# Patient Record
Sex: Female | Born: 1955 | Race: White | Hispanic: No | Marital: Married
Health system: Southern US, Community
[De-identification: ages and names within clinical notes are randomized; demographics above are authoritative.]

---

## 2014-08-14 DIAGNOSIS — I781 Nevus, non-neoplastic: Secondary | ICD-10-CM | POA: Insufficient documentation

## 2014-08-14 DIAGNOSIS — I8390 Asymptomatic varicose veins of unspecified lower extremity: Secondary | ICD-10-CM

## 2014-08-14 DIAGNOSIS — M7989 Other specified soft tissue disorders: Secondary | ICD-10-CM

## 2014-08-14 HISTORY — DX: Asymptomatic varicose veins of unspecified lower extremity: I83.90

## 2014-08-14 HISTORY — DX: Other specified soft tissue disorders: M79.89

## 2014-08-14 HISTORY — DX: Nevus, non-neoplastic: I78.1

## 2015-01-19 ENCOUNTER — Ambulatory Visit (INDEPENDENT_AMBULATORY_CARE_PROVIDER_SITE_OTHER): Payer: Managed Care, Other (non HMO)

## 2015-01-19 ENCOUNTER — Ambulatory Visit (INDEPENDENT_AMBULATORY_CARE_PROVIDER_SITE_OTHER): Payer: Managed Care, Other (non HMO) | Admitting: Sports Medicine

## 2015-01-19 ENCOUNTER — Encounter: Payer: Self-pay | Admitting: Sports Medicine

## 2015-01-19 VITALS — BP 150/89 | HR 78 | Ht 65.0 in | Wt 162.0 lb

## 2015-01-19 DIAGNOSIS — M25551 Pain in right hip: Secondary | ICD-10-CM

## 2015-01-19 DIAGNOSIS — M160 Bilateral primary osteoarthritis of hip: Secondary | ICD-10-CM

## 2015-01-19 HISTORY — DX: Bilateral primary osteoarthritis of hip: M16.0

## 2015-01-19 MED ORDER — MELOXICAM 15 MG PO TABS: ORAL_TABLET | ORAL | Status: DC

## 2015-01-19 NOTE — Progress Notes (Signed)
   Subjective:    I'm seeing this patient as a consultation for:  Dr. Robert Scott at [facility]  CC: Right hip pain  HPI: For several weeks this pleasant 59 year old female has had pain that she localizes deep in the groin, worse with inhalation, as well as flexion. She denies any injuries, pain has been fairly insidious. She's been using occasional oral Toradol, she was placed on gabapentin by her primary care provider which also has not been effective. Eventually she did have an MRI of the pelvis and hip, that was negative. X-rays did show some mild degenerative changes.  Pain is moderate, persistent, localized. No mechanical symptoms.  Past medical history, Surgical history, Family history not pertinant except as noted below, Social history, Allergies, and medications have been entered into the medical record, reviewed, and no changes needed.   Review of Systems: No headache, visual changes, nausea, vomiting, diarrhea, constipation, dizziness, abdominal pain, skin rash, fevers, chills, night sweats, weight loss, swollen lymph nodes, body aches, joint swelling, muscle aches, chest pain, shortness of breath, mood changes, visual or auditory hallucinations.   Objective:   General: Well Developed, well nourished, and in no acute distress.  Neuro/Psych: Alert and oriented x3, extra-ocular muscles intact, able to move all 4 extremities, sensation grossly intact. Skin: Warm and dry, no rashes noted.  Respiratory: Not using accessory muscles, speaking in full sentences, trachea midline.  Cardiovascular: Pulses palpable, no extremity edema. Abdomen: Does not appear distended. Right Hip: ROM IR: 60 Deg, ER: 60 Deg, Flexion: 120 Deg, Extension: 100 Deg, Abduction: 45 Deg, Adduction: 45 Deg, reproduction of pain with internal rotation. Strength IR: 5/5, ER: 5/5, Flexion: 5/5, Extension: 5/5, Abduction: 5/5, Adduction: 5/5 Pelvic alignment unremarkable to inspection and  palpation. Standing hip rotation and gait without trendelenburg / unsteadiness. Positive flexion adduction and internal rotation test. Greater trochanter without tenderness to palpation. No tenderness over piriformis. No SI joint tenderness and normal minimal SI movement.  Procedure: Real-time Ultrasound Guided Injection of right femoral acetabular joint Device: GE Logiq E  Verbal informed consent obtained.  Time-out conducted.  Noted no overlying erythema, induration, or other signs of local infection.  Skin prepped in a sterile fashion.  Local anesthesia: Topical Ethyl chloride.  With sterile technique and under real time ultrasound guidance:  Spinal needle advanced to the femoral head/neck junction, 2 mL kenalog 40, 4 mL lidocaine injected easily. Completed without difficulty  Pain immediately resolved suggesting accurate placement of the medication.  Advised to call if fevers/chills, erythema, induration, drainage, or persistent bleeding.  Images permanently stored and available for review in the ultrasound unit.  Impression: Technically successful ultrasound guided injection.  Impression and Recommendations:   This case required medical decision making of moderate complexity. [place] not here yetunder the 1:15

## 2015-01-19 NOTE — Assessment & Plan Note (Signed)
With degenerative changes on x-ray and a negative MRI of the hip and pelvis. 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 Switching to meloxicam, femoral acetabular injection as above, hip rehabilitation exercises.  Return in one month.

## 2015-02-21 ENCOUNTER — Ambulatory Visit (INDEPENDENT_AMBULATORY_CARE_PROVIDER_SITE_OTHER): Payer: Managed Care, Other (non HMO) | Admitting: Sports Medicine

## 2015-02-21 ENCOUNTER — Encounter: Payer: Self-pay | Admitting: Sports Medicine

## 2015-02-21 ENCOUNTER — Ambulatory Visit (INDEPENDENT_AMBULATORY_CARE_PROVIDER_SITE_OTHER): Payer: Managed Care, Other (non HMO)

## 2015-02-21 VITALS — BP 141/89 | HR 64 | Ht 65.5 in | Wt 161.0 lb

## 2015-02-21 DIAGNOSIS — M25561 Pain in right knee: Secondary | ICD-10-CM

## 2015-02-21 DIAGNOSIS — M7042 Prepatellar bursitis, left knee: Secondary | ICD-10-CM | POA: Diagnosis not present

## 2015-02-21 DIAGNOSIS — M170 Bilateral primary osteoarthritis of knee: Secondary | ICD-10-CM | POA: Insufficient documentation

## 2015-02-21 DIAGNOSIS — M8588 Other specified disorders of bone density and structure, other site: Secondary | ICD-10-CM

## 2015-02-21 DIAGNOSIS — M1611 Unilateral primary osteoarthritis, right hip: Secondary | ICD-10-CM

## 2015-02-21 HISTORY — DX: Bilateral primary osteoarthritis of knee: M17.0

## 2015-02-21 MED ORDER — IBUPROFEN-FAMOTIDINE 800-26.6 MG PO TABS: 1.0000 | ORAL_TABLET | Freq: Three times a day (TID) | ORAL | Status: DC

## 2015-02-21 NOTE — Assessment & Plan Note (Signed)
Minimal, nontender. May return for surgical excision if desires.

## 2015-02-21 NOTE — Assessment & Plan Note (Signed)
Bilateral x-rays, Duexis samples given. Rehabilitation exercises given. If no better in 3-4 weeks return for bilateral injections.

## 2015-02-21 NOTE — Progress Notes (Signed)
  Subjective:    CC: follow-up  HPI: Right hip osteoarthritis: Doing extremely well with 98% pain relief after femoral acetabular joint injection at the last visit.  Bilateral knee pain: History of osteoarthritis, amenable to try conservative measures before injecting. No mechanical symptoms, pain is at the joint lines.  Mass left knee: For sometime now another has been a well-defined, movable, only minimally tender mass over the anterior aspect of the left patella.  Past medical history, Surgical history, Family history not pertinant except as noted below, Social history, Allergies, and medications have been entered into the medical record, reviewed, and no changes needed.   Review of Systems: No fevers, chills, night sweats, weight loss, chest pain, or shortness of breath.   Objective:    General: Well Developed, well nourished, and in no acute distress.  Neuro: Alert and oriented x3, extra-ocular muscles intact, sensation grossly intact.  HEENT: Normocephalic, atraumatic, pupils equal round reactive to light, neck supple, no masses, no lymphadenopathy, thyroid nonpalpable.  Skin: Warm and dry, no rashes. Cardiac: Regular rate and rhythm, no murmurs rubs or gallops, no lower extremity edema.  Respiratory: Clear to auscultation bilaterally. Not using accessory muscles, speaking in full sentences. Bilateral knees: Right worse than left effusion with a palpable fluid wave, there is also a visible and palpable, well-defined, 1.1 cm movable nodule over the left patella. This is consistent with the prepatellar bursa versus subcutaneous cyst ROM normal in flexion and extension and lower leg rotation. Ligaments with solid consistent endpoints including ACL, PCL, LCL, MCL. Negative Mcmurray's and provocative meniscal tests. Non painful patellar compression. Patellar and quadriceps tendons unremarkable. Hamstring and quadriceps strength is normal.  Impression and Recommendations:

## 2015-02-21 NOTE — Assessment & Plan Note (Signed)
Doing well after femoral acetabular joint injection. Return as needed for this.

## 2015-03-02 ENCOUNTER — Other Ambulatory Visit: Payer: Self-pay

## 2015-03-02 DIAGNOSIS — M7042 Prepatellar bursitis, left knee: Secondary | ICD-10-CM

## 2015-03-02 MED ORDER — IBUPROFEN-FAMOTIDINE 800-26.6 MG PO TABS: 1.0000 | ORAL_TABLET | Freq: Three times a day (TID) | ORAL | Status: DC

## 2015-03-13 ENCOUNTER — Ambulatory Visit (INDEPENDENT_AMBULATORY_CARE_PROVIDER_SITE_OTHER): Payer: Managed Care, Other (non HMO) | Admitting: Sports Medicine

## 2015-03-13 ENCOUNTER — Encounter: Payer: Self-pay | Admitting: Sports Medicine

## 2015-03-13 VITALS — BP 161/94 | HR 92 | Ht 65.5 in | Wt 162.0 lb

## 2015-03-13 DIAGNOSIS — M160 Bilateral primary osteoarthritis of hip: Secondary | ICD-10-CM | POA: Diagnosis not present

## 2015-03-13 DIAGNOSIS — M170 Bilateral primary osteoarthritis of knee: Secondary | ICD-10-CM | POA: Diagnosis not present

## 2015-03-13 DIAGNOSIS — M7042 Prepatellar bursitis, left knee: Secondary | ICD-10-CM

## 2015-03-13 NOTE — Assessment & Plan Note (Signed)
Excellent response to right femoral acetabular injection, left femoral acetabular injection today. Return for custom orthotics.

## 2015-03-13 NOTE — Progress Notes (Signed)
  Subjective:    CC: Left hip pain  HPI: Kristin Rosales returns, we did a right femoral acetabular injection for osteoarthritis and she improved considerably. She is here with similar pain in the left side and would like interventional treatment today, pain is moderate, persistent, localized in the groin without radiation, worse with weightbearing and with stiffness in the mornings.  Prepatellar bursitis: Left knee, would like excision.  Knee osteoarthritis: Doing okay with Duexis.  Past medical history, Surgical history, Family history not pertinant except as noted below, Social history, Allergies, and medications have been entered into the medical record, reviewed, and no changes needed.   Review of Systems: No fevers, chills, night sweats, weight loss, chest pain, or shortness of breath.   Objective:    General: Well Developed, well nourished, and in no acute distress.  Neuro: Alert and oriented x3, extra-ocular muscles intact, sensation grossly intact.  HEENT: Normocephalic, atraumatic, pupils equal round reactive to light, neck supple, no masses, no lymphadenopathy, thyroid nonpalpable.  Skin: Warm and dry, no rashes. Cardiac: Regular rate and rhythm, no murmurs rubs or gallops, no lower extremity edema.  Respiratory: Clear to auscultation bilaterally. Not using accessory muscles, speaking in full sentences. Left Hip: ROM IR: 60 Deg, ER: 60 Deg, Flexion: 120 Deg, Extension: 100 Deg, Abduction: 45 Deg, Adduction: 45 Deg, internal rotation causes pain Strength IR: 5/5, ER: 5/5, Flexion: 5/5, Extension: 5/5, Abduction: 5/5, Adduction: 5/5 Pelvic alignment unremarkable to inspection and palpation. Standing hip rotation and gait without trendelenburg / unsteadiness. Greater trochanter without tenderness to palpation. No tenderness over piriformis. No SI joint tenderness and normal minimal SI movement. Left knee: 2 cm subcutaneous cyst palpable over the patella.  Procedure: Real-time  Ultrasound Guided Injection of left femoroacetabular joint Device: GE Logiq E  Verbal informed consent obtained.  Time-out conducted.  Noted no overlying erythema, induration, or other signs of local infection.  Skin prepped in a sterile fashion.  Local anesthesia: Topical Ethyl chloride.  With sterile technique and under real time ultrasound guidance:  Spinal needle advanced in the femoral head/neck junction, 2 mL kenalog 40, 4 mL lidocaine injected easily. Completed without difficulty  Pain immediately resolved suggesting accurate placement of the medication.  Advised to call if fevers/chills, erythema, induration, drainage, or persistent bleeding.  Images permanently stored and available for review in the ultrasound unit.  Impression: Technically successful ultrasound guided injection.  Impression and Recommendations:

## 2015-03-13 NOTE — Assessment & Plan Note (Signed)
Return for excision. There is a small 2 cm prepatellar bursal cyst on the left knee

## 2015-03-13 NOTE — Assessment & Plan Note (Signed)
Return for custom orthotics 

## 2015-03-15 ENCOUNTER — Ambulatory Visit: Payer: Managed Care, Other (non HMO) | Admitting: Sports Medicine

## 2015-03-21 ENCOUNTER — Ambulatory Visit: Payer: Managed Care, Other (non HMO) | Admitting: Sports Medicine

## 2015-03-25 DIAGNOSIS — L03116 Cellulitis of left lower limb: Secondary | ICD-10-CM

## 2015-03-25 DIAGNOSIS — R110 Nausea: Secondary | ICD-10-CM

## 2015-03-25 DIAGNOSIS — I10 Essential (primary) hypertension: Secondary | ICD-10-CM

## 2015-03-25 DIAGNOSIS — M79605 Pain in left leg: Secondary | ICD-10-CM

## 2015-03-25 HISTORY — DX: Pain in left leg: M79.605

## 2015-03-25 HISTORY — DX: Cellulitis of left lower limb: L03.116

## 2015-03-25 HISTORY — DX: Essential (primary) hypertension: I10

## 2015-03-25 HISTORY — DX: Nausea: R11.0

## 2015-03-27 ENCOUNTER — Ambulatory Visit: Payer: Managed Care, Other (non HMO) | Admitting: Sports Medicine

## 2015-03-30 ENCOUNTER — Ambulatory Visit: Payer: Managed Care, Other (non HMO) | Admitting: Sports Medicine

## 2015-04-02 ENCOUNTER — Encounter: Payer: Managed Care, Other (non HMO) | Admitting: Sports Medicine

## 2015-04-06 ENCOUNTER — Ambulatory Visit: Payer: Managed Care, Other (non HMO) | Admitting: Sports Medicine

## 2015-04-12 ENCOUNTER — Encounter: Payer: Managed Care, Other (non HMO) | Admitting: Sports Medicine

## 2015-04-16 ENCOUNTER — Ambulatory Visit: Payer: Managed Care, Other (non HMO) | Admitting: Sports Medicine

## 2015-04-17 ENCOUNTER — Ambulatory Visit (INDEPENDENT_AMBULATORY_CARE_PROVIDER_SITE_OTHER): Payer: Managed Care, Other (non HMO) | Admitting: Sports Medicine

## 2015-04-17 ENCOUNTER — Encounter: Payer: Self-pay | Admitting: Sports Medicine

## 2015-04-17 VITALS — BP 135/87 | HR 65 | Ht 65.0 in | Wt 159.0 lb

## 2015-04-17 DIAGNOSIS — M160 Bilateral primary osteoarthritis of hip: Secondary | ICD-10-CM | POA: Diagnosis not present

## 2015-04-17 NOTE — Assessment & Plan Note (Signed)
Custom orthotics as above, essentially complete pain relief after hip joint injection at the last visit.

## 2015-04-17 NOTE — Progress Notes (Signed)

## 2015-04-23 ENCOUNTER — Ambulatory Visit (INDEPENDENT_AMBULATORY_CARE_PROVIDER_SITE_OTHER): Payer: Managed Care, Other (non HMO) | Admitting: Sports Medicine

## 2015-04-23 ENCOUNTER — Encounter: Payer: Self-pay | Admitting: Sports Medicine

## 2015-04-23 VITALS — BP 158/93 | HR 79 | Ht 65.0 in | Wt 159.0 lb

## 2015-04-23 DIAGNOSIS — M7042 Prepatellar bursitis, left knee: Secondary | ICD-10-CM | POA: Diagnosis not present

## 2015-04-23 DIAGNOSIS — M170 Bilateral primary osteoarthritis of knee: Secondary | ICD-10-CM

## 2015-04-23 NOTE — Assessment & Plan Note (Addendum)
During local anesthesia the prepatellar bursa was decompressed, decision was made not to proceed with surgical excision.

## 2015-04-23 NOTE — Assessment & Plan Note (Signed)
Left-sided knee steroidal injection as above.  Return in one month. She tells me if things go well with the left knee she would like me to do the same with the right knee.

## 2015-04-23 NOTE — Progress Notes (Signed)
  Subjective:    CC: Follow-up  HPI: Prepatellar bursitis: Here for consideration of excision  Knee osteoarthritis: Left-sided, pain at the medial and lateral joint lines, stiffness in the morning and with bending the knee, no radiation, no mechanical symptoms.  Past medical history, Surgical history, Family history not pertinant except as noted below, Social history, Allergies, and medications have been entered into the medical record, reviewed, and no changes needed.   Review of Systems: No fevers, chills, night sweats, weight loss, chest pain, or shortness of breath.   Objective:    General: Well Developed, well nourished, and in no acute distress.  Neuro: Alert and oriented x3, extra-ocular muscles intact, sensation grossly intact.  HEENT: Normocephalic, atraumatic, pupils equal round reactive to light, neck supple, no masses, no lymphadenopathy, thyroid nonpalpable.  Skin: Warm and dry, no rashes. Cardiac: Regular rate and rhythm, no murmurs rubs or gallops, no lower extremity edema.  Respiratory: Clear to auscultation bilaterally. Not using accessory muscles, speaking in full sentences. Left Knee: Visible prepatellar bursa, also tender to palpation at the medial and lateral joint lines ROM normal in flexion and extension and lower leg rotation. Ligaments with solid consistent endpoints including ACL, PCL, LCL, MCL. Negative Mcmurray's and provocative meniscal tests. Non painful patellar compression. Patellar and quadriceps tendons unremarkable. Hamstring and quadriceps strength is normal.  During the local anesthesia for the prepatellar bursa, the bursa was decompressed, and decision was made not to proceed with an attempt on excision.  Procedure: Real-time Ultrasound Guided Injection of left knee Device: GE Logiq E  Verbal informed consent obtained.  Time-out conducted.  Noted no overlying erythema, induration, or other signs of local infection.  Skin prepped in a sterile  fashion.  Local anesthesia: Topical Ethyl chloride.  With sterile technique and under real time ultrasound guidance:  2 mL Kenalog 40, 4 mL lidocaine injected easily Completed without difficulty  Pain immediately resolved suggesting accurate placement of the medication.  Advised to call if fevers/chills, erythema, induration, drainage, or persistent bleeding.  Images permanently stored and available for review in the ultrasound unit.  Impression: Technically successful ultrasound guided injection.  Impression and Recommendations:

## 2015-05-18 ENCOUNTER — Telehealth: Payer: Self-pay

## 2015-05-18 DIAGNOSIS — M7042 Prepatellar bursitis, left knee: Secondary | ICD-10-CM

## 2015-05-18 MED ORDER — IBUPROFEN-FAMOTIDINE 800-26.6 MG PO TABS: 1.0000 | ORAL_TABLET | Freq: Three times a day (TID) | ORAL | Status: DC

## 2015-05-18 NOTE — Telephone Encounter (Signed)
Patient to find out if there is a cheaper pharmacy for Duexis. She received the discount on the first prescription but wasn't sure if she would receive it for the second.

## 2015-05-18 NOTE — Telephone Encounter (Signed)
Yes, sending it to [facility] pharmacy in [place], they will call her and mail it overnight.

## 2015-05-21 ENCOUNTER — Other Ambulatory Visit: Payer: Self-pay | Admitting: Sports Medicine

## 2015-05-21 DIAGNOSIS — M7042 Prepatellar bursitis, left knee: Secondary | ICD-10-CM

## 2015-05-21 MED ORDER — IBUPROFEN-FAMOTIDINE 800-26.6 MG PO TABS: 1.0000 | ORAL_TABLET | Freq: Three times a day (TID) | ORAL | Status: DC

## 2015-05-21 NOTE — Telephone Encounter (Signed)
Left detailed message advising of recommendations.  

## 2015-05-24 ENCOUNTER — Ambulatory Visit: Payer: Managed Care, Other (non HMO) | Admitting: Sports Medicine

## 2015-06-14 ENCOUNTER — Ambulatory Visit: Payer: Managed Care, Other (non HMO) | Admitting: Sports Medicine

## 2015-07-16 ENCOUNTER — Ambulatory Visit: Payer: Managed Care, Other (non HMO) | Admitting: Sports Medicine

## 2015-07-23 ENCOUNTER — Ambulatory Visit: Payer: Managed Care, Other (non HMO) | Admitting: Sports Medicine

## 2015-08-02 ENCOUNTER — Ambulatory Visit (INDEPENDENT_AMBULATORY_CARE_PROVIDER_SITE_OTHER): Payer: Managed Care, Other (non HMO) | Admitting: Sports Medicine

## 2015-08-02 ENCOUNTER — Encounter: Payer: Self-pay | Admitting: Sports Medicine

## 2015-08-02 VITALS — BP 148/92 | HR 71 | Wt 151.0 lb

## 2015-08-02 DIAGNOSIS — M7042 Prepatellar bursitis, left knee: Secondary | ICD-10-CM | POA: Diagnosis not present

## 2015-08-02 MED ORDER — IBUPROFEN-FAMOTIDINE 800-26.6 MG PO TABS: 1.0000 | ORAL_TABLET | Freq: Three times a day (TID) | ORAL | Status: DC

## 2015-08-02 NOTE — Assessment & Plan Note (Signed)
Injection as above. Return in one month, surgical excision if no better.

## 2015-08-02 NOTE — Progress Notes (Signed)
  Subjective:    CC: follow-up  HPI: His is a pleasant 59 year old female,previously we were going to excise a left prepatellar bursa,the bursa was decompressed during the local anesthesia and this was aborted. She now has a recurrence, and desires interventional treatment today.  Bilateral hip arthritis: Doing well.Only has occasional pain here and there.  Past medical history, Surgical history, Family history not pertinant except as noted below, Social history, Allergies, and medications have been entered into the medical record, reviewed, and no changes needed.   Review of Systems: No fevers, chills, night sweats, weight loss, chest pain, or shortness of breath.   Objective:    General: Well Developed, well nourished, and in no acute distress.  Neuro: Alert and oriented x3, extra-ocular muscles intact, sensation grossly intact.  HEENT: Normocephalic, atraumatic, pupils equal round reactive to light, neck supple, no masses, no lymphadenopathy, thyroid nonpalpable.  Skin: Warm and dry, no rashes. Cardiac: Regular rate and rhythm, no murmurs rubs or gallops, no lower extremity edema.  Respiratory: Clear to auscultation bilaterally. Not using accessory muscles, speaking in full sentences. Left Knee: Visible and palpable nontender prepatellar bursa over the anterior knee. ROM normal in flexion and extension and lower leg rotation. Ligaments with solid consistent endpoints including ACL, PCL, LCL, MCL. Negative Mcmurray's and provocative meniscal tests. Non painful patellar compression. Patellar and quadriceps tendons unremarkable. Hamstring and quadriceps strength is normal.  Procedure: Real-time Ultrasound Guided Injection of left patellar bursa Device: GE Logiq E  Verbal informed consent obtained.  Time-out conducted.  Noted no overlying erythema, induration, or other signs of local infection.  Skin prepped in a sterile fashion.  Local anesthesia: Topical Ethyl chloride.  With  sterile technique and under real time ultrasound guidance:  25-gauge needle advanced into hypoechoic structure, 1 mL kenalog 40 injected easily. Completed without difficulty  Pain immediately resolved suggesting accurate placement of the medication.  Advised to call if fevers/chills, erythema, induration, drainage, or persistent bleeding.  Images permanently stored and available for review in the ultrasound unit.  Impression: Technically successful ultrasound guided injection.  The knee was then strapped with compressive dressing.  Impression and Recommendations:

## 2015-08-30 ENCOUNTER — Ambulatory Visit: Payer: Managed Care, Other (non HMO) | Admitting: Sports Medicine

## 2016-01-08 ENCOUNTER — Other Ambulatory Visit: Payer: Self-pay | Admitting: *Deleted

## 2016-01-08 DIAGNOSIS — M7042 Prepatellar bursitis, left knee: Secondary | ICD-10-CM

## 2016-01-08 MED ORDER — IBUPROFEN-FAMOTIDINE 800-26.6 MG PO TABS: 1.0000 | ORAL_TABLET | Freq: Three times a day (TID) | ORAL | Status: DC

## 2016-05-08 ENCOUNTER — Telehealth: Payer: Self-pay

## 2016-05-08 DIAGNOSIS — M7042 Prepatellar bursitis, left knee: Secondary | ICD-10-CM

## 2016-05-08 NOTE — Telephone Encounter (Signed)
Pt was given a rx for Duexis but due to recent changes in insurance the prices are too much. Would like to know what she can do different to get this rx. Please advise.

## 2016-05-09 ENCOUNTER — Other Ambulatory Visit: Payer: Self-pay | Admitting: Sports Medicine

## 2016-05-09 DIAGNOSIS — M7042 Prepatellar bursitis, left knee: Secondary | ICD-10-CM

## 2016-05-09 MED ORDER — IBUPROFEN-FAMOTIDINE 800-26.6 MG PO TABS: 1.0000 | ORAL_TABLET | Freq: Three times a day (TID) | ORAL | 11 refills | Status: DC

## 2016-05-09 NOTE — Telephone Encounter (Signed)
We are going to try it again but this time to [facility] in [place]. (mail order)

## 2016-05-09 NOTE — Telephone Encounter (Signed)
Left message advising patient of new pharmacy.

## 2016-07-02 ENCOUNTER — Telehealth: Payer: Self-pay | Admitting: Family Medicine

## 2016-07-02 ENCOUNTER — Other Ambulatory Visit: Payer: Self-pay

## 2016-07-02 DIAGNOSIS — M7042 Prepatellar bursitis, left knee: Secondary | ICD-10-CM

## 2016-07-02 MED ORDER — IBUPROFEN-FAMOTIDINE 800-26.6 MG PO TABS: 1.0000 | ORAL_TABLET | Freq: Three times a day (TID) | ORAL | 11 refills | Status: DC

## 2016-07-02 NOTE — Telephone Encounter (Signed)
Leta, Pt calling about her Duexsis. The best number to call her on is her cell number & she says she has vm.  I gave her your message that you will talk to Dr T. Thank you.

## 2017-06-22 DIAGNOSIS — S00411A Abrasion of right ear, initial encounter: Secondary | ICD-10-CM

## 2017-06-22 HISTORY — DX: Abrasion of right ear, initial encounter: S00.411A

## 2017-08-26 DIAGNOSIS — R946 Abnormal results of thyroid function studies: Secondary | ICD-10-CM | POA: Diagnosis not present

## 2017-08-26 DIAGNOSIS — D649 Anemia, unspecified: Secondary | ICD-10-CM | POA: Diagnosis not present

## 2017-11-20 ENCOUNTER — Ambulatory Visit (INDEPENDENT_AMBULATORY_CARE_PROVIDER_SITE_OTHER): Payer: Managed Care, Other (non HMO) | Admitting: Sports Medicine

## 2017-11-20 DIAGNOSIS — M533 Sacrococcygeal disorders, not elsewhere classified: Secondary | ICD-10-CM

## 2017-11-20 DIAGNOSIS — M160 Bilateral primary osteoarthritis of hip: Secondary | ICD-10-CM

## 2017-11-20 HISTORY — DX: Sacrococcygeal disorders, not elsewhere classified: M53.3

## 2017-11-20 MED ORDER — CELECOXIB 200 MG PO CAPS
ORAL_CAPSULE | ORAL | 2 refills | Status: DC
Start: 2017-11-20 — End: 2018-01-12

## 2017-11-20 NOTE — Assessment & Plan Note (Addendum)
Continues to do well after injection 3 years ago, right hip is having a slight recurrence of pain compared to the left. We are going to switch her from ibuprofen to Celebrex, she is having some trochanteric bursitis type pain as well, right-sided. Has failed ibuprofen, naproxen, meloxicam. If persistence of symptoms after rehab exercises then we will do an injection.

## 2017-11-20 NOTE — Assessment & Plan Note (Signed)
Adding sacroiliac joint rehab exercises, return in 2-4 weeks, injection if no better.

## 2017-11-20 NOTE — Progress Notes (Signed)
Subjective:    I'm seeing this patient as a consultation for: Dr. Lucila Maineobert Scott  CC: Right hip pain  HPI: This is a pleasant 62 year old female, I treated her 3 years ago for bilateral hip pain, we did a hip joint injection on both sides separated by a month, she did extremely well.  She returns today, her hip joint pain has returned, although mild, not back to how much it hurt before her injection.  She describes her pain differently today, more laterally in the right hip, but in the groin and the left hip.  She also describes moderate pain over her right sacroiliac joint without radiation.  Symptoms are moderate, persistent, she does find it difficult to lay on the ipsilateral side.  I reviewed the past medical history, family history, social history, surgical history, and allergies today and no changes were needed.  Please see the problem list section below in epic for further details.  Past Medical History: No past medical history on file. Past Surgical History: No past surgical history on file. Social History: Social History   Socioeconomic History  . Marital status: Married    Spouse name: Not on file  . Number of children: Not on file  . Years of education: Not on file  . Highest education level: Not on file  Occupational History  . Not on file  Social Needs  . Financial resource strain: Not on file  . Food insecurity:    Worry: Not on file    Inability: Not on file  . Transportation needs:    Medical: Not on file    Non-medical: Not on file  Tobacco Use  . Smoking status: Unknown If Ever Smoked  Substance and Sexual Activity  . Alcohol use: Not on file  . Drug use: Not on file  . Sexual activity: Not on file  Lifestyle  . Physical activity:    Days per week: Not on file    Minutes per session: Not on file  . Stress: Not on file  Relationships  . Social connections:    Talks on phone: Not on file    Gets together: Not on file    Attends religious service:  Not on file    Active member of club or organization: Not on file    Attends meetings of clubs or organizations: Not on file    Relationship status: Not on file  Other Topics Concern  . Not on file  Social History Narrative  . Not on file   Family History: No family history on file. Allergies: Allergies  Allergen Reactions  . Penicillins Hives    PATIENT STATED WHEN SHE WAS A CHILD SHE BROKE OUT IN HIVES AFTER TAKING PENICILLIN   Medications: See med rec.  Review of Systems: No headache, visual changes, nausea, vomiting, diarrhea, constipation, dizziness, abdominal pain, skin rash, fevers, chills, night sweats, weight loss, swollen lymph nodes, body aches, joint swelling, muscle aches, chest pain, shortness of breath, mood changes, visual or auditory hallucinations.   Objective:   General: Well Developed, well nourished, and in no acute distress.  Neuro:  Extra-ocular muscles intact, able to move all 4 extremities, sensation grossly intact.  Deep tendon reflexes tested were normal. Psych: Alert and oriented, mood congruent with affect. ENT:  Ears and nose appear unremarkable.  Hearing grossly normal. Neck: Unremarkable overall appearance, trachea midline.  No visible thyroid enlargement. Eyes: Conjunctivae and lids appear unremarkable.  Pupils equal and round. Skin: Warm and dry, no rashes  noted.  Cardiovascular: Pulses palpable, no extremity edema. Right hip: ROM IR: 60 Deg, ER: 60 Deg, Flexion: 120 Deg, Extension: 100 Deg, Abduction: 45 Deg, Adduction: 45 Deg Strength IR: 5/5, ER: 5/5, Flexion: 5/5, Extension: 5/5, Abduction: 5/5, Adduction: 5/5 Pelvic alignment unremarkable to inspection and palpation. Standing hip rotation and gait without trendelenburg / unsteadiness. Greater trochanter with tenderness to palpation. No tenderness over piriformis. No SI joint tenderness and normal minimal SI movement. Back Exam:  Inspection: Unremarkable  Motion: Flexion 45 deg,  Extension 45 deg, Side Bending to 45 deg bilaterally,  Rotation to 45 deg bilaterally  SLR laying: Negative  XSLR laying: Negative  Palpable tenderness: Right sacroiliac joint. FABER: negative. Sensory change: Gross sensation intact to all lumbar and sacral dermatomes.  Reflexes: 2+ at both patellar tendons, 2+ at achilles tendons, Babinski's downgoing.  Strength at foot  Plantar-flexion: 5/5 Dorsi-flexion: 5/5 Eversion: 5/5 Inversion: 5/5  Leg strength  Quad: 5/5 Hamstring: 5/5 Hip flexor: 5/5 Hip abductors: 5/5  Gait unremarkable.  Impression and Recommendations:   This case required medical decision making of moderate complexity.  Primary osteoarthritis of both hips Continues to do well after injection 3 years ago, right hip is having a slight recurrence of pain compared to the left. We are going to switch her from ibuprofen to Celebrex, she is having some trochanteric bursitis type pain as well, right-sided. Has failed ibuprofen, naproxen, meloxicam. If persistence of symptoms after rehab exercises then we will do an injection.  Pain of right sacroiliac joint Adding sacroiliac joint rehab exercises, return in 2-4 weeks, injection if no better. ___________________________________________ Ihor Austin. Benjamin Stain, M.D., ABFM., CAQSM. Primary Care and Sports Medicine [facility]  Adjunct Instructor of Family Medicine  [employer]

## 2018-01-12 ENCOUNTER — Other Ambulatory Visit: Payer: Self-pay | Admitting: Sports Medicine

## 2018-01-12 DIAGNOSIS — M160 Bilateral primary osteoarthritis of hip: Secondary | ICD-10-CM

## 2018-02-09 ENCOUNTER — Other Ambulatory Visit: Payer: Self-pay | Admitting: Sports Medicine

## 2018-02-09 DIAGNOSIS — M160 Bilateral primary osteoarthritis of hip: Secondary | ICD-10-CM

## 2018-02-24 ENCOUNTER — Other Ambulatory Visit: Payer: Self-pay | Admitting: Sports Medicine

## 2018-02-24 DIAGNOSIS — M160 Bilateral primary osteoarthritis of hip: Secondary | ICD-10-CM

## 2018-06-03 ENCOUNTER — Other Ambulatory Visit: Payer: Self-pay | Admitting: Sports Medicine

## 2018-06-03 DIAGNOSIS — M160 Bilateral primary osteoarthritis of hip: Secondary | ICD-10-CM

## 2018-09-04 ENCOUNTER — Other Ambulatory Visit: Payer: Self-pay | Admitting: Sports Medicine

## 2018-09-04 DIAGNOSIS — M160 Bilateral primary osteoarthritis of hip: Secondary | ICD-10-CM

## 2019-03-17 ENCOUNTER — Ambulatory Visit (INDEPENDENT_AMBULATORY_CARE_PROVIDER_SITE_OTHER): Payer: Managed Care, Other (non HMO)

## 2019-03-17 ENCOUNTER — Ambulatory Visit (INDEPENDENT_AMBULATORY_CARE_PROVIDER_SITE_OTHER): Payer: Managed Care, Other (non HMO) | Admitting: Sports Medicine

## 2019-03-17 ENCOUNTER — Other Ambulatory Visit: Payer: Self-pay

## 2019-03-17 DIAGNOSIS — M160 Bilateral primary osteoarthritis of hip: Secondary | ICD-10-CM

## 2019-03-17 DIAGNOSIS — M533 Sacrococcygeal disorders, not elsewhere classified: Secondary | ICD-10-CM

## 2019-03-17 DIAGNOSIS — M7042 Prepatellar bursitis, left knee: Secondary | ICD-10-CM

## 2019-03-17 MED ORDER — IBUPROFEN-FAMOTIDINE 800-26.6 MG PO TABS: 1.0000 | ORAL_TABLET | Freq: Three times a day (TID) | ORAL | 11 refills | Status: DC

## 2019-03-17 MED ORDER — CYCLOBENZAPRINE HCL 10 MG PO TABS
ORAL_TABLET | ORAL | 0 refills | Status: DC
Start: 2019-03-17 — End: 2022-08-11

## 2019-03-17 NOTE — Progress Notes (Signed)
Subjective:    CC: Hip pain  HPI: This is a very pleasant 63 year old female, treated her in the distant past for hip osteoarthritis with an injection approximately 4 years ago, this worked extremely well.  She also has right SI joint pathology.  Historically she has done well with Duexis, more recently having a recurrence of pain in the right anterolateral hip and groin, moderate gelling.  Occasional catching of the mechanical symptoms, no trauma.  I reviewed the past medical history, family history, social history, surgical history, and allergies today and no changes were needed.  Please see the problem list section below in epic for further details.  Past Medical History: No past medical history on file. Past Surgical History: No past surgical history on file. Social History: Social History   Socioeconomic History  . Marital status: Married    Spouse name: Not on file  . Number of children: Not on file  . Years of education: Not on file  . Highest education level: Not on file  Occupational History  . Not on file  Social Needs  . Financial resource strain: Not on file  . Food insecurity    Worry: Not on file    Inability: Not on file  . Transportation needs    Medical: Not on file    Non-medical: Not on file  Tobacco Use  . Smoking status: Unknown If Ever Smoked  Substance and Sexual Activity  . Alcohol use: Not on file  . Drug use: Not on file  . Sexual activity: Not on file  Lifestyle  . Physical activity    Days per week: Not on file    Minutes per session: Not on file  . Stress: Not on file  Relationships  . Social Musicianconnections    Talks on phone: Not on file    Gets together: Not on file    Attends religious service: Not on file    Active member of club or organization: Not on file    Attends meetings of clubs or organizations: Not on file    Relationship status: Not on file  Other Topics Concern  . Not on file  Social History Narrative  . Not on file    Family History: No family history on file. Allergies: Allergies  Allergen Reactions  . Penicillins Hives    PATIENT STATED WHEN SHE WAS A CHILD SHE BROKE OUT IN [place]ES AFTER TAKING PENICILLIN   Medications: See med rec.  Review of Systems: No fevers, chills, night sweats, weight loss, chest pain, or shortness of breath.   Objective:    General: Well Developed, well nourished, and in no acute distress.  Neuro: Alert and oriented x3, extra-ocular muscles intact, sensation grossly intact.  HEENT: Normocephalic, atraumatic, pupils equal round reactive to light, neck supple, no masses, no lymphadenopathy, thyroid nonpalpable.  Skin: Warm and dry, no rashes. Cardiac: Regular rate and rhythm, no murmurs rubs or gallops, no lower extremity edema.  Respiratory: Clear to auscultation bilaterally. Not using accessory muscles, speaking in full sentences. Right hip: ROM IR: 60 Deg, reproduction of pain with internal rotation, ER: 60 Deg, Flexion: 120 Deg, Extension: 100 Deg, Abduction: 45 Deg, Adduction: 45 Deg Strength IR: 5/5, ER: 5/5, Flexion: 5/5, Extension: 5/5, Abduction: 5/5, Adduction: 5/5 Pelvic alignment unremarkable to inspection and palpation. Standing hip rotation and gait without trendelenburg / unsteadiness. Greater trochanter without tenderness to palpation. No tenderness over piriformis. No SI joint tenderness and normal minimal SI movement.  Hip x-rays personally reviewed,  only mild osteoarthritis.  Impression and Recommendations:    Primary osteoarthritis of both hips Recurrence of pain, right worse than left. I injected her back in 2016, she is just now starting to have a slight recurrence of pain. Refilling Duexis per her request. If no better in a few weeks we can do hip joint injections. I would like updated x-rays.  Pain of right sacroiliac joint Refilling Duexis, adding Flexeril at bedtime.    ___________________________________________ Gwen Her.  Dianah Field, M.D., ABFM., CAQSM. Primary Care and Sports Medicine [facility] [facility]  Adjunct Professor of [place] [employer]

## 2019-03-17 NOTE — Assessment & Plan Note (Signed)
Refilling Duexis, adding Flexeril at bedtime.

## 2019-03-17 NOTE — Assessment & Plan Note (Signed)
Recurrence of pain, right worse than left. I injected her back in 2016, she is just now starting to have a slight recurrence of pain. Refilling Duexis per her request. If no better in a few weeks we can do hip joint injections. I would like updated x-rays.

## 2019-12-12 ENCOUNTER — Other Ambulatory Visit: Payer: Self-pay

## 2019-12-12 ENCOUNTER — Ambulatory Visit (INDEPENDENT_AMBULATORY_CARE_PROVIDER_SITE_OTHER): Payer: Managed Care, Other (non HMO)

## 2019-12-12 ENCOUNTER — Ambulatory Visit (INDEPENDENT_AMBULATORY_CARE_PROVIDER_SITE_OTHER): Payer: Managed Care, Other (non HMO) | Admitting: Sports Medicine

## 2019-12-12 ENCOUNTER — Encounter: Payer: Self-pay | Admitting: Sports Medicine

## 2019-12-12 DIAGNOSIS — M25512 Pain in left shoulder: Secondary | ICD-10-CM | POA: Diagnosis not present

## 2019-12-12 DIAGNOSIS — Y990 Civilian activity done for income or pay: Secondary | ICD-10-CM | POA: Diagnosis not present

## 2019-12-12 DIAGNOSIS — M19022 Primary osteoarthritis, left elbow: Secondary | ICD-10-CM | POA: Insufficient documentation

## 2019-12-12 DIAGNOSIS — M25422 Effusion, left elbow: Secondary | ICD-10-CM | POA: Insufficient documentation

## 2019-12-12 DIAGNOSIS — M25522 Pain in left elbow: Secondary | ICD-10-CM

## 2019-12-12 DIAGNOSIS — M25562 Pain in left knee: Secondary | ICD-10-CM | POA: Diagnosis not present

## 2019-12-12 HISTORY — DX: Primary osteoarthritis, left elbow: M19.022

## 2019-12-12 MED ORDER — MELOXICAM 15 MG PO TABS: ORAL_TABLET | ORAL | 3 refills | Status: DC

## 2019-12-12 NOTE — Assessment & Plan Note (Signed)
A week and a half ago this pleasant 64 year old female was helping a heavy patient. The patient fell and she tried to catch her, she had pain in her left anterior shoulder, lateral elbow, and posterior medial knee. On exam she has positive biceps signs as well as a weak supraspinatus and her shoulder, tenderness over the, extensor tendon origin as well as distal biceps in her elbow, and pain at the posterior medial joint line with worsening of symptoms with terminal flexion in her knee consistent with meniscal injury. Adding x-rays of her shoulder, elbow, knee. Meloxicam, out of work for 2 weeks, return to see me in 2 weeks, I have advised that she file this is a Designer, multimedia. claim.

## 2019-12-12 NOTE — Progress Notes (Signed)
    Procedures performed today:    None.  Independent interpretation of notes and tests performed by another provider:   None.  Brief History, Exam, Impression, and Recommendations:    Work related injury A week and a half ago this pleasant 64 year old female was helping a heavy patient. The patient fell and she tried to catch her, she had pain in her left anterior shoulder, lateral elbow, and posterior medial knee. On exam she has positive biceps signs as well as a weak supraspinatus and her shoulder, tenderness over the, extensor tendon origin as well as distal biceps in her elbow, and pain at the posterior medial joint line with worsening of symptoms with terminal flexion in her knee consistent with meniscal injury. Adding x-rays of her shoulder, elbow, knee. Meloxicam, out of work for 2 weeks, return to see me in 2 weeks, I have advised that she file this is a Designer, multimedia. claim.    ___________________________________________ Ihor Austin. Benjamin Stain, M.D., ABFM., CAQSM. Primary Care and Sports Medicine [place] [facility]  Adjunct Instructor of Family Medicine  [employer]

## 2019-12-22 ENCOUNTER — Telehealth: Payer: Self-pay | Admitting: *Deleted

## 2019-12-22 NOTE — Telephone Encounter (Signed)
Pt left vm regarding some information about her disability/FMLA forms.  I returned her call and also had to leave her a vm.

## 2019-12-26 ENCOUNTER — Other Ambulatory Visit: Payer: Self-pay

## 2019-12-26 ENCOUNTER — Ambulatory Visit (INDEPENDENT_AMBULATORY_CARE_PROVIDER_SITE_OTHER): Payer: Managed Care, Other (non HMO) | Admitting: Sports Medicine

## 2019-12-26 DIAGNOSIS — S8992XA Unspecified injury of left lower leg, initial encounter: Secondary | ICD-10-CM

## 2019-12-26 DIAGNOSIS — M7061 Trochanteric bursitis, right hip: Secondary | ICD-10-CM | POA: Diagnosis not present

## 2019-12-26 DIAGNOSIS — M25422 Effusion, left elbow: Secondary | ICD-10-CM | POA: Diagnosis not present

## 2019-12-26 DIAGNOSIS — S4992XD Unspecified injury of left shoulder and upper arm, subsequent encounter: Secondary | ICD-10-CM

## 2019-12-26 DIAGNOSIS — M7062 Trochanteric bursitis, left hip: Secondary | ICD-10-CM

## 2019-12-26 DIAGNOSIS — S8992XD Unspecified injury of left lower leg, subsequent encounter: Secondary | ICD-10-CM | POA: Diagnosis not present

## 2019-12-26 DIAGNOSIS — S4992XA Unspecified injury of left shoulder and upper arm, initial encounter: Secondary | ICD-10-CM

## 2019-12-26 HISTORY — DX: Trochanteric bursitis, right hip: M70.61

## 2019-12-26 HISTORY — DX: Unspecified injury of left lower leg, initial encounter: S89.92XA

## 2019-12-26 HISTORY — DX: Unspecified injury of left shoulder and upper arm, initial encounter: S49.92XA

## 2019-12-26 NOTE — Assessment & Plan Note (Signed)
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 This occurred on 11/30/2019. We treated her conservatively, she continues to have discomfort. She is also getting this covered as a Teacher, adult education. case. Because of her persistent discomfort and degenerative changes on x-rays we are going to proceed with MRI of the left shoulder.

## 2019-12-26 NOTE — Progress Notes (Addendum)
    Procedures performed today:    Today we filled out extensive disability paperwork which will be scanned into her chart.  Procedure: Real-time Ultrasound Guided injection of the left greater trochanteric bursa Device: Samsung HS60  Verbal informed consent obtained.  Time-out conducted.  Noted no overlying erythema, induration, or other signs of local infection.  Skin prepped in a sterile fashion.  Local anesthesia: Topical Ethyl chloride.  With sterile technique and under real time ultrasound guidance: 1 cc Kenalog 40, 2 cc lidocaine, 2 cc bupivacaine injected easily Completed without difficulty  Pain immediately resolved suggesting accurate placement of the medication.  Advised to call if fevers/chills, erythema, induration, drainage, or persistent bleeding.  Images permanently stored and available for review in the ultrasound unit.  Impression: Technically successful ultrasound guided injection.  Procedure: Real-time Ultrasound Guided injection of the right greater trochanteric bursa Device: Samsung HS60  Verbal informed consent obtained.  Time-out conducted.  Noted no overlying erythema, induration, or other signs of local infection.  Skin prepped in a sterile fashion.  Local anesthesia: Topical Ethyl chloride.  With sterile technique and under real time ultrasound guidance: 1 cc Kenalog 40, 2 cc lidocaine, 2 cc bupivacaine injected easily Completed without difficulty  Pain immediately resolved suggesting accurate placement of the medication.  Advised to call if fevers/chills, erythema, induration, drainage, or persistent bleeding.  Images permanently stored and available for review in the ultrasound unit.  Impression: Technically successful ultrasound guided injection.  Independent interpretation of notes and tests performed by another provider:   None.  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 This occurred on 11/30/2019. We treated her conservatively, she continues to have discomfort. She is also getting this covered as a Teacher, adult education. case. Because of her persistent discomfort and effusion on x-rays we are going to proceed with MRI of the left elbow.  Injury of left shoulder Kristin Rosales had a work-related injury where she tried to catch a heavy patient, they fell onto her left anterior shoulder, elbow, knee. This occurred on 11/30/2019. We treated her conservatively, she continues to have discomfort. She is also getting this covered as a Teacher, adult education. case. Because of her persistent discomfort and degenerative changes on x-rays we are going to proceed with MRI of the left shoulder.  Left knee injury Kristin Rosales had a work-related injury where she tried to catch a heavy patient, they fell onto her left anterior shoulder, elbow, knee. This occurred on 11/30/2019. We treated her conservatively. She is also getting this covered as a Teacher, adult education. case. Her symptoms have improved considerably so we will not proceed with advanced imaging of her knee.  Trochanteric bursitis of both hips Kristin Rosales returns, she has bilateral lateral hip pain, worse when laying on the ipsilateral sides at night, on exam she has tenderness over the greater trochanteric bursae bilaterally. Today we performed bilateral trochanteric bursa injections, she can return to see me in a month for this.    ___________________________________________ Kristin Rosales. Kristin Rosales, M.D., ABFM., CAQSM. Primary Care and Sports Medicine [place] [facility]  Adjunct Instructor of Family Medicine  [employer]

## 2019-12-26 NOTE — Assessment & Plan Note (Signed)
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 This occurred on 11/30/2019. We treated her conservatively. She is also getting this covered as a Teacher, adult education. case. Her symptoms have improved considerably so we will not proceed with advanced imaging of her knee.

## 2019-12-26 NOTE — Assessment & Plan Note (Signed)
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 This occurred on 11/30/2019. We treated her conservatively, she continues to have discomfort. She is also getting this covered as a Teacher, adult education. case. Because of her persistent discomfort and effusion on x-rays we are going to proceed with MRI of the left elbow.

## 2019-12-26 NOTE — Assessment & Plan Note (Signed)
Kristin Rosales returns, she has bilateral lateral hip pain, worse when laying on the ipsilateral sides at night, on exam she has tenderness over the greater trochanteric bursae bilaterally. Today we performed bilateral trochanteric bursa injections, she can return to see me in a month for this.

## 2020-01-01 ENCOUNTER — Other Ambulatory Visit: Payer: Self-pay

## 2020-01-01 ENCOUNTER — Ambulatory Visit (INDEPENDENT_AMBULATORY_CARE_PROVIDER_SITE_OTHER): Payer: Managed Care, Other (non HMO)

## 2020-01-01 ENCOUNTER — Ambulatory Visit: Payer: Managed Care, Other (non HMO)

## 2020-01-01 DIAGNOSIS — S4992XD Unspecified injury of left shoulder and upper arm, subsequent encounter: Secondary | ICD-10-CM

## 2020-01-01 DIAGNOSIS — M25422 Effusion, left elbow: Secondary | ICD-10-CM | POA: Diagnosis not present

## 2020-01-06 ENCOUNTER — Encounter: Payer: Self-pay | Admitting: Sports Medicine

## 2020-01-06 ENCOUNTER — Other Ambulatory Visit: Payer: Self-pay

## 2020-01-06 ENCOUNTER — Ambulatory Visit (INDEPENDENT_AMBULATORY_CARE_PROVIDER_SITE_OTHER): Payer: Managed Care, Other (non HMO) | Admitting: Sports Medicine

## 2020-01-06 DIAGNOSIS — S4992XD Unspecified injury of left shoulder and upper arm, subsequent encounter: Secondary | ICD-10-CM

## 2020-01-06 NOTE — Assessment & Plan Note (Signed)
Kristin Rosales returns, she is a pleasant 64 year old female, she had a work-related injury. Ultimately MRI showed rotator cuff tendinopathy, she failed conservative treatment so today we proceeded with subacromial injection with ultrasound guidance. Return to see me in 1 month. We may do her elbow joint injection at that time if needed. Unfortunately it sounds as though her workers comp and disability claims were denied. Out of work until the 18th.

## 2020-01-06 NOTE — Progress Notes (Signed)
    Procedures performed today:    Procedure: Real-time Ultrasound Guided injection of the left subacromial bursa Device: Samsung HS60  Verbal informed consent obtained.  Time-out conducted.  Noted no overlying erythema, induration, or other signs of local infection.  Skin prepped in a sterile fashion.  Local anesthesia: Topical Ethyl chloride.  With sterile technique and under real time ultrasound guidance: 1 cc Kenalog 40, 1 cc lidocaine, 1 cc bupivacaine injected easily Completed without difficulty  Pain immediately resolved suggesting accurate placement of the medication.  Advised to call if fevers/chills, erythema, induration, drainage, or persistent bleeding.  Images permanently stored and available for review in the ultrasound unit.  Impression: Technically successful ultrasound guided injection.  Independent interpretation of notes and tests performed by another provider:   None.  Brief History, Exam, Impression, and Recommendations:    Injury of left shoulder Kristin Rosales returns, Kristin Rosales is a pleasant 64 year old female, Kristin Rosales had a work-related injury. Ultimately MRI showed rotator cuff tendinopathy, Kristin Rosales failed conservative treatment so today we proceeded with subacromial injection with ultrasound guidance. Return to see me in 1 month. We may do her elbow joint injection at that time if needed. Unfortunately it sounds as though her workers comp and disability claims were denied. Out of work until the 18th.     ___________________________________________ Ihor Austin. Benjamin Stain, M.D., ABFM., CAQSM. Primary Care and Sports Medicine [place] [facility]  Adjunct Instructor of Family Medicine  [employer]

## 2020-01-10 ENCOUNTER — Ambulatory Visit: Payer: Managed Care, Other (non HMO) | Admitting: Sports Medicine

## 2020-01-12 ENCOUNTER — Telehealth: Payer: Self-pay | Admitting: Sports Medicine

## 2020-01-12 NOTE — Telephone Encounter (Signed)
Received fax for [place] on Duexis sent through cover my meds waiting on determination. - CF

## 2020-01-13 ENCOUNTER — Telehealth: Payer: Self-pay | Admitting: *Deleted

## 2020-01-13 ENCOUNTER — Ambulatory Visit (INDEPENDENT_AMBULATORY_CARE_PROVIDER_SITE_OTHER): Payer: Managed Care, Other (non HMO) | Admitting: Sports Medicine

## 2020-01-13 ENCOUNTER — Other Ambulatory Visit: Payer: Self-pay

## 2020-01-13 DIAGNOSIS — M25422 Effusion, left elbow: Secondary | ICD-10-CM

## 2020-01-13 NOTE — Telephone Encounter (Signed)
Lets double book her at 66 with a physical since it will likely be quick.  We we will do the injection and nothing more and as long as she is okay with this I am okay with booking her there.

## 2020-01-13 NOTE — Telephone Encounter (Signed)
Pt scheduled  

## 2020-01-13 NOTE — Assessment & Plan Note (Signed)
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 11/30/2019 was date of injury. She continued to have pain after conservative treatment, we obtained an MRI that showed 8 advanced osteoarthritis as well as radial collateral ligament injury. Today we are going to inject her elbow joint, return to see me in 1 month for this.

## 2020-01-13 NOTE — Progress Notes (Signed)
    Procedures performed today:    Procedure: Real-time Ultrasound Guided injection of the left elbow joint Device: Samsung HS60  Verbal informed consent obtained.  Time-out conducted.  Noted no overlying erythema, induration, or other signs of local infection.  Skin prepped in a sterile fashion.  Local anesthesia: Topical Ethyl chloride.  With sterile technique and under real time ultrasound guidance:  Using a 25-gauge needle advanced through the anconeus muscle into the elbow joint from a posterior lateral approach and injected 1 cc Kenalog 40, 1 cc lidocaine, 1 cc bupivacaine.   Completed without difficulty  Pain immediately resolved suggesting accurate placement of the medication.  Advised to call if fevers/chills, erythema, induration, drainage, or persistent bleeding.  Images permanently stored and available for review in the ultrasound unit.  Impression: Technically successful ultrasound guided injection.  Independent interpretation of notes and tests performed by another provider:   None.  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 11/30/2019 was date of injury. She continued to have pain after conservative treatment, we obtained an MRI that showed 8 advanced osteoarthritis as well as radial collateral ligament injury. Today we are going to inject her elbow joint, return to see me in 1 month for this.    ___________________________________________ Ihor Austin. Benjamin Stain, M.D., ABFM., CAQSM. Primary Care and Sports Medicine [place] [facility]  Adjunct Instructor of Family Medicine  [employer]

## 2020-01-13 NOTE — Telephone Encounter (Signed)
Pt called and wanted to know if she could come this afternoon for an elbow injection.  She is leaving tomorrow morning and will be our of state for the next 2 weeks.

## 2020-01-16 ENCOUNTER — Encounter: Payer: Self-pay | Admitting: Sports Medicine

## 2020-01-16 ENCOUNTER — Telehealth: Payer: Self-pay | Admitting: *Deleted

## 2020-01-16 NOTE — Telephone Encounter (Signed)
Pt left vm needing another work note written for June 7th this time.

## 2020-01-17 NOTE — Telephone Encounter (Signed)
Received fax from [place] they denied coverage on Duexis due to patient must try and fail all formulary preferred and non preferred drugs first. Placing in providers box for further review. - CF

## 2020-02-03 ENCOUNTER — Encounter: Payer: Self-pay | Admitting: Sports Medicine

## 2020-02-03 ENCOUNTER — Ambulatory Visit (INDEPENDENT_AMBULATORY_CARE_PROVIDER_SITE_OTHER): Payer: Managed Care, Other (non HMO) | Admitting: Sports Medicine

## 2020-02-03 DIAGNOSIS — M19022 Primary osteoarthritis, left elbow: Secondary | ICD-10-CM | POA: Diagnosis not present

## 2020-02-03 DIAGNOSIS — M160 Bilateral primary osteoarthritis of hip: Secondary | ICD-10-CM | POA: Diagnosis not present

## 2020-02-03 DIAGNOSIS — M5416 Radiculopathy, lumbar region: Secondary | ICD-10-CM | POA: Diagnosis not present

## 2020-02-03 DIAGNOSIS — S4992XD Unspecified injury of left shoulder and upper arm, subsequent encounter: Secondary | ICD-10-CM | POA: Diagnosis not present

## 2020-02-03 HISTORY — DX: Radiculopathy, lumbar region: M54.16

## 2020-02-03 MED ORDER — TRAMADOL HCL 50 MG PO TABS: 50.0000 mg | ORAL_TABLET | Freq: Three times a day (TID) | ORAL | 0 refills | Status: DC | PRN

## 2020-02-03 MED ORDER — GABAPENTIN 300 MG PO CAPS: 300.0000 mg | ORAL_CAPSULE | Freq: Every day | ORAL | 3 refills | Status: DC

## 2020-02-03 NOTE — Assessment & Plan Note (Signed)
Kristin Rosales returns, at the last visit I did an injection into her left elbow joint, MRI did show advanced osteoarthritis as well as a radial collateral injury. Her symptoms have improved considerably, she still has a bit of discomfort in her elbow which is expected.

## 2020-02-03 NOTE — Assessment & Plan Note (Signed)
Today Kristin Rosales brought up pain radiating from her left buttock into her left posterior lateral calf. Worse with sitting for prolonged periods of time, this is likely radicular in origin. Adding gabapentin, 300 mg at bedtime to start out with. We can follow this up in a month or so. I am going to keep her out of work for another 2 weeks.

## 2020-02-03 NOTE — Assessment & Plan Note (Signed)
There is also bilateral hip osteoarthritis, ibuprofen has been helpful, we are going to add some tramadol for breakthrough pain. Holding off on hip joint injections for now.

## 2020-02-03 NOTE — Assessment & Plan Note (Signed)
Kristin Rosales also has some shoulder pain, occurred after a work-related injury, rotator cuff tendinopathy was noted on MRI. We did a subacromial injection, and she has also had some improvement here.

## 2020-02-03 NOTE — Progress Notes (Signed)
    Procedures performed today:    None.  Independent interpretation of notes and tests performed by another provider:   None.  Brief History, Exam, Impression, and Recommendations:    Primary osteoarthritis of left elbow Kristin Rosales returns, at the last visit I did an injection into her left elbow joint, MRI did show advanced osteoarthritis as well as a radial collateral injury. Her symptoms have improved considerably, she still has a bit of discomfort in her elbow which is expected.  Injury of left shoulder Kristin Rosales also has some shoulder pain, occurred after a work-related injury, rotator cuff tendinopathy was noted on MRI. We did a subacromial injection, and she has also had some improvement here.  Primary osteoarthritis of both hips There is also bilateral hip osteoarthritis, ibuprofen has been helpful, we are going to add some tramadol for breakthrough pain. Holding off on hip joint injections for now.  Left lumbar radiculitis Today Kristin Rosales brought up pain radiating from her left buttock into her left posterior lateral calf. Worse with sitting for prolonged periods of time, this is likely radicular in origin. Adding gabapentin, 300 mg at bedtime to start out with. We can follow this up in a month or so. I am going to keep her out of work for another 2 weeks.    ___________________________________________ Ihor Austin. Benjamin Stain, M.D., ABFM., CAQSM. Primary Care and Sports Medicine [place] [facility]  Adjunct Instructor of Family Medicine  [employer]

## 2020-02-09 ENCOUNTER — Telehealth: Payer: Self-pay | Admitting: Sports Medicine

## 2020-02-09 NOTE — Telephone Encounter (Signed)
Disability paperwork filled out today.

## 2020-03-02 ENCOUNTER — Ambulatory Visit: Payer: Self-pay | Admitting: Sports Medicine

## 2020-03-07 ENCOUNTER — Ambulatory Visit: Payer: Self-pay | Admitting: Sports Medicine

## 2020-03-16 ENCOUNTER — Ambulatory Visit: Payer: Self-pay | Admitting: Sports Medicine

## 2020-05-15 ENCOUNTER — Ambulatory Visit (INDEPENDENT_AMBULATORY_CARE_PROVIDER_SITE_OTHER): Payer: Managed Care, Other (non HMO) | Admitting: Sports Medicine

## 2020-05-15 ENCOUNTER — Ambulatory Visit (INDEPENDENT_AMBULATORY_CARE_PROVIDER_SITE_OTHER): Payer: Managed Care, Other (non HMO)

## 2020-05-15 DIAGNOSIS — M160 Bilateral primary osteoarthritis of hip: Secondary | ICD-10-CM | POA: Diagnosis not present

## 2020-05-15 MED ORDER — IBUPROFEN 800 MG PO TABS: 800.0000 mg | ORAL_TABLET | Freq: Three times a day (TID) | ORAL | 3 refills | Status: DC | PRN

## 2020-05-15 NOTE — Assessment & Plan Note (Signed)
Recurrence of bilateral hip pain, known bilateral hip arthritis, patient had declined the COVID-19 vaccine so it sounds like she is losing her job, and will be losing insurance, I am going to go ahead and inject both of her hips today. Return as needed.

## 2020-05-15 NOTE — Progress Notes (Signed)
    Procedures performed today:    Procedure: Real-time Ultrasound Guided injection of the left hip joint Device: Samsung HS60  Verbal informed consent obtained.  Time-out conducted.  Noted no overlying erythema, induration, or other signs of local infection.  Skin prepped in a sterile fashion.  Local anesthesia: Topical Ethyl chloride.  With sterile technique and under real time ultrasound guidance: 1 cc Kenalog 40, 2 cc lidocaine, 2 cc bupivacaine injected easily Completed without difficulty  Pain immediately resolved suggesting accurate placement of the medication.  Advised to call if fevers/chills, erythema, induration, drainage, or persistent bleeding.  Images permanently stored and available for review in PACS.  Impression: Technically successful ultrasound guided injection.  Procedure: Real-time Ultrasound Guided injection of the right hip joint Device: Samsung HS60  Verbal informed consent obtained.  Time-out conducted.  Noted no overlying erythema, induration, or other signs of local infection.  Skin prepped in a sterile fashion.  Local anesthesia: Topical Ethyl chloride.  With sterile technique and under real time ultrasound guidance: 1 cc Kenalog 40, 2 cc lidocaine, 2 cc bupivacaine injected easily Completed without difficulty  Pain immediately resolved suggesting accurate placement of the medication.  Advised to call if fevers/chills, erythema, induration, drainage, or persistent bleeding.  Images permanently stored and available for review in PACS.  Impression: Technically successful ultrasound guided injection.  ___________________________________________ Ihor Austin. Benjamin Stain, M.D., ABFM., CAQSM. Primary Care and Sports Medicine [place] [facility]   Adjunct Instructor of Family Medicine  [employer]  Independent interpretation of notes and tests performed by another provider:   None.  Brief History, Exam,  Impression, and Recommendations:    Primary osteoarthritis of both hips Recurrence of bilateral hip pain, known bilateral hip arthritis, patient had declined the COVID-19 vaccine so it sounds like she is losing her job, and will be losing insurance, I am going to go ahead and inject both of her hips today. Return as needed.    ___________________________________________ Ihor Austin. Benjamin Stain, M.D., ABFM., CAQSM. Primary Care and Sports Medicine [place] [facility]  Adjunct Instructor of Family Medicine  [employer]

## 2020-05-28 ENCOUNTER — Telehealth: Payer: Self-pay | Admitting: *Deleted

## 2020-05-28 MED ORDER — IBUPROFEN-FAMOTIDINE 800-26.6 MG PO TABS: 1.0000 | ORAL_TABLET | Freq: Three times a day (TID) | ORAL | 3 refills | Status: DC

## 2020-05-28 NOTE — Telephone Encounter (Signed)
Pt left vm wanting for you to send in Duexis to the preferred pharmacy instead of just the Ibuprofen 800mg  that was sent to her local [facility].

## 2020-05-28 NOTE — Telephone Encounter (Signed)
Done

## 2020-05-31 ENCOUNTER — Telehealth: Payer: Self-pay | Admitting: Neurology

## 2020-05-31 NOTE — Telephone Encounter (Signed)
Called [employer], patient's PA department for her insurance coverage, to get PA on Ibuprofen-Famotidine and they state med does not need PA, it is just excluded from coverage. Patient can not get with insurance.

## 2020-05-31 NOTE — Telephone Encounter (Signed)
[employer] number [phone]

## 2020-06-05 ENCOUNTER — Telehealth: Payer: Self-pay | Admitting: *Deleted

## 2020-06-05 ENCOUNTER — Other Ambulatory Visit: Payer: Self-pay | Admitting: *Deleted

## 2020-06-11 ENCOUNTER — Ambulatory Visit: Payer: Managed Care, Other (non HMO) | Admitting: Sports Medicine

## 2020-06-15 ENCOUNTER — Ambulatory Visit: Payer: Managed Care, Other (non HMO) | Admitting: Sports Medicine

## 2020-06-18 ENCOUNTER — Ambulatory Visit (INDEPENDENT_AMBULATORY_CARE_PROVIDER_SITE_OTHER): Payer: Managed Care, Other (non HMO) | Admitting: Sports Medicine

## 2020-06-18 DIAGNOSIS — M160 Bilateral primary osteoarthritis of hip: Secondary | ICD-10-CM | POA: Diagnosis not present

## 2020-06-18 DIAGNOSIS — M5416 Radiculopathy, lumbar region: Secondary | ICD-10-CM

## 2020-06-18 NOTE — Progress Notes (Signed)
    Procedures performed today:    None.  Independent interpretation of notes and tests performed by another provider:   None.  Brief History, Exam, Impression, and Recommendations:    Primary osteoarthritis of both hips Kristin Rosales continues to have bilateral hip pain, she really did not get a whole lot of relief from her injections, we are going to repeat x-rays today. She does need an MRI but does not have insurance so we will hold off on this for now. Continue current medications and she will let me know when she is ready for me to order the MRI.  Left lumbar radiculitis As above she continues to have pain in her back with radiation around the hips bilaterally. Not sufficiently improved with current medications. Updating x-rays, she does need a lumbar spine MRI, no insurance so she will let me know when she is ready for this.    ___________________________________________ Ihor Austin. Benjamin Stain, M.D., ABFM., CAQSM. Primary Care and Sports Medicine [facility]  Adjunct Instructor of Family Medicine  [employer]

## 2020-06-18 NOTE — Assessment & Plan Note (Signed)
As above she continues to have pain in her back with radiation around the hips bilaterally. Not sufficiently improved with current medications. Updating x-rays, she does need a lumbar spine MRI, no insurance so she will let me know when she is ready for this.

## 2020-06-18 NOTE — Assessment & Plan Note (Signed)
Kristin Rosales continues to have bilateral hip pain, she really did not get a whole lot of relief from her injections, we are going to repeat x-rays today. She does need an MRI but does not have insurance so we will hold off on this for now. Continue current medications and she will let me know when she is ready for me to order the MRI.

## 2020-07-17 IMAGING — DX DG SHOULDER 2+V*L*
3 series · 3 of 3 positions shown · non-contrast
Comparison: None

CLINICAL DATA: Tried to catch a patient at she fell, LEFT shoulder
pain

EXAM:
LEFT SHOULDER - 2+ VIEW

[shoulder grashey]
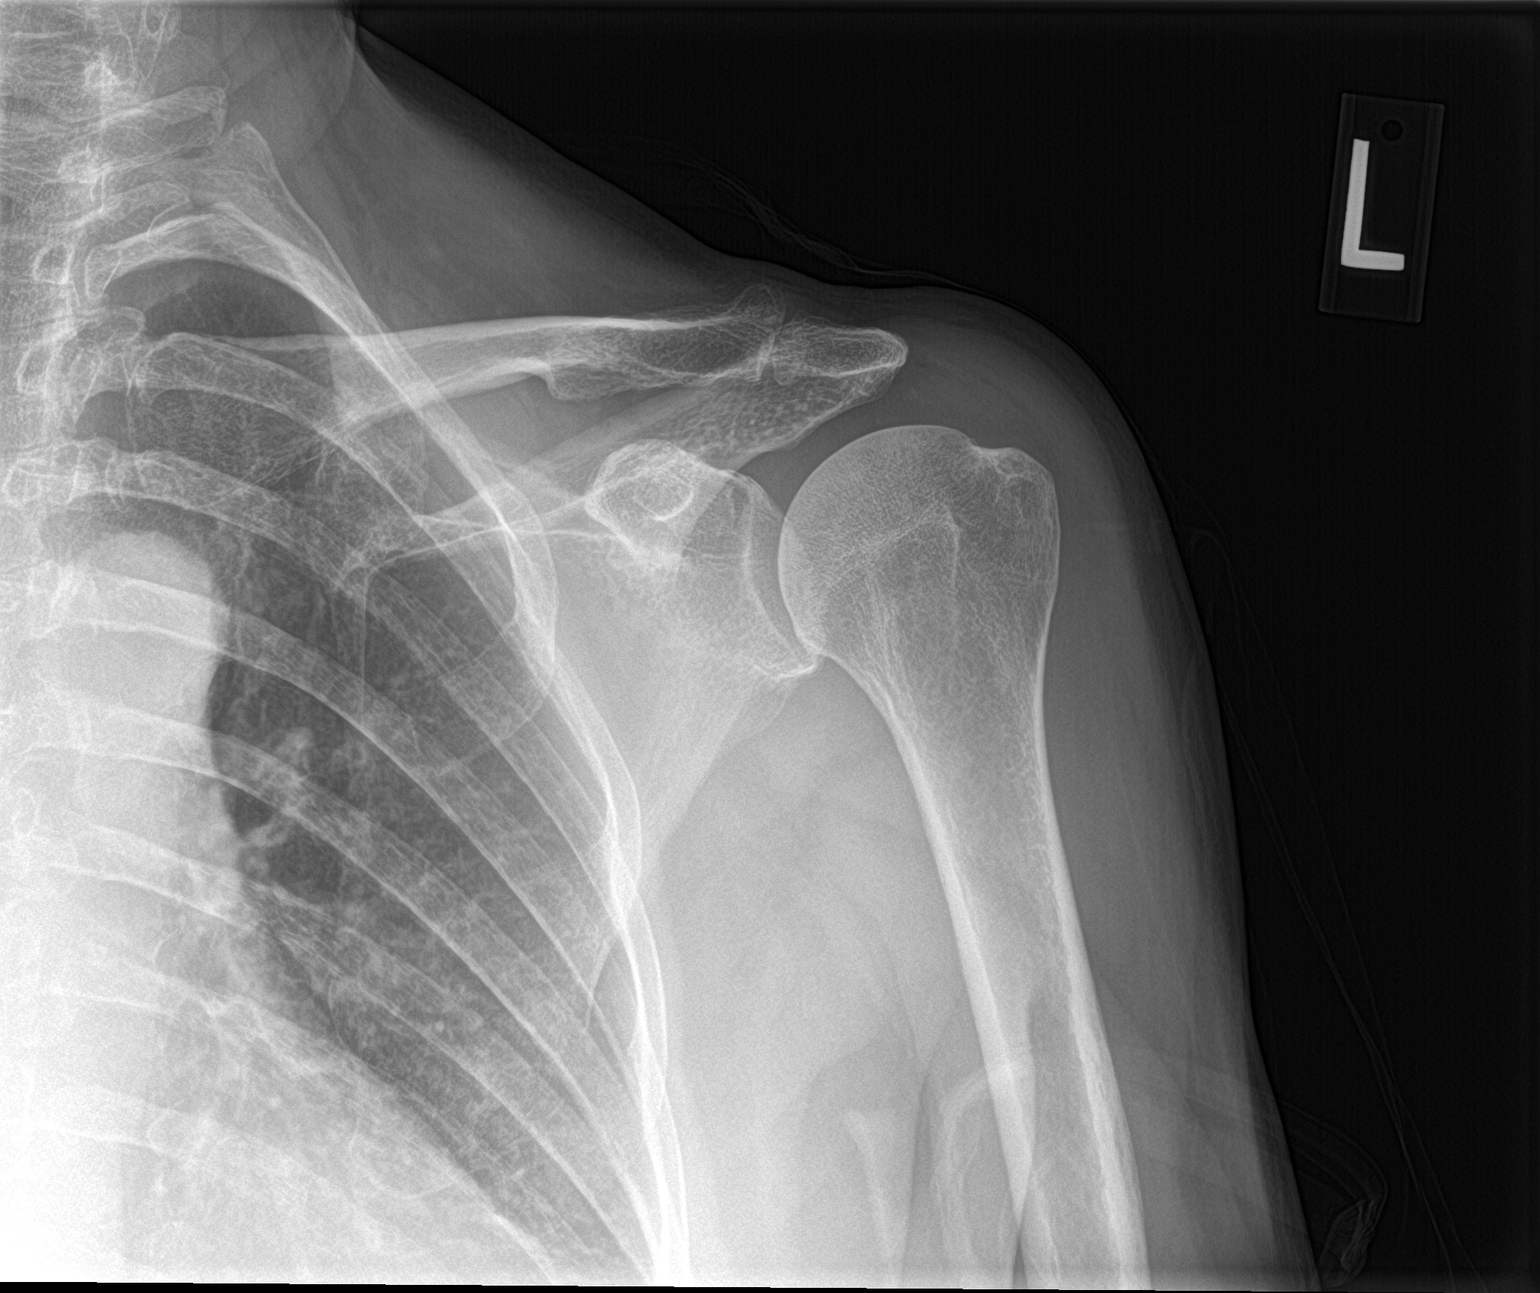

[shoulder y view]
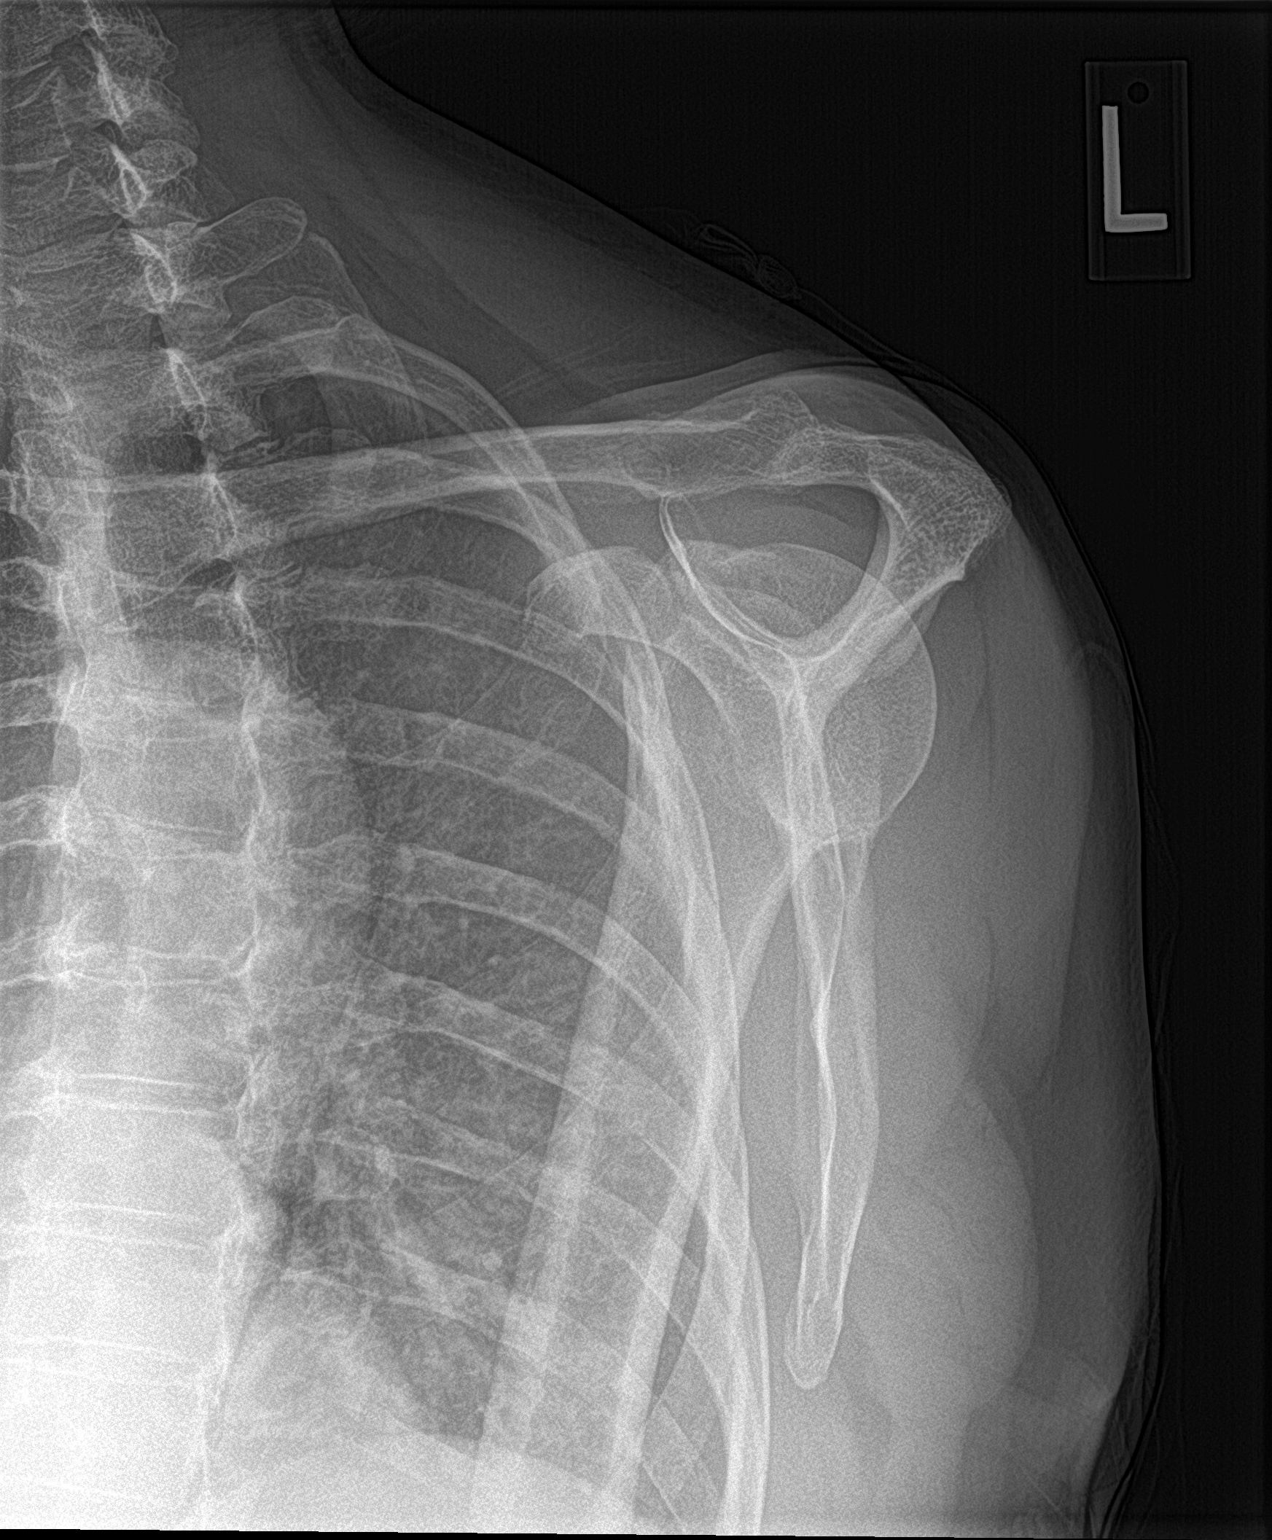

[shoulder axillary]
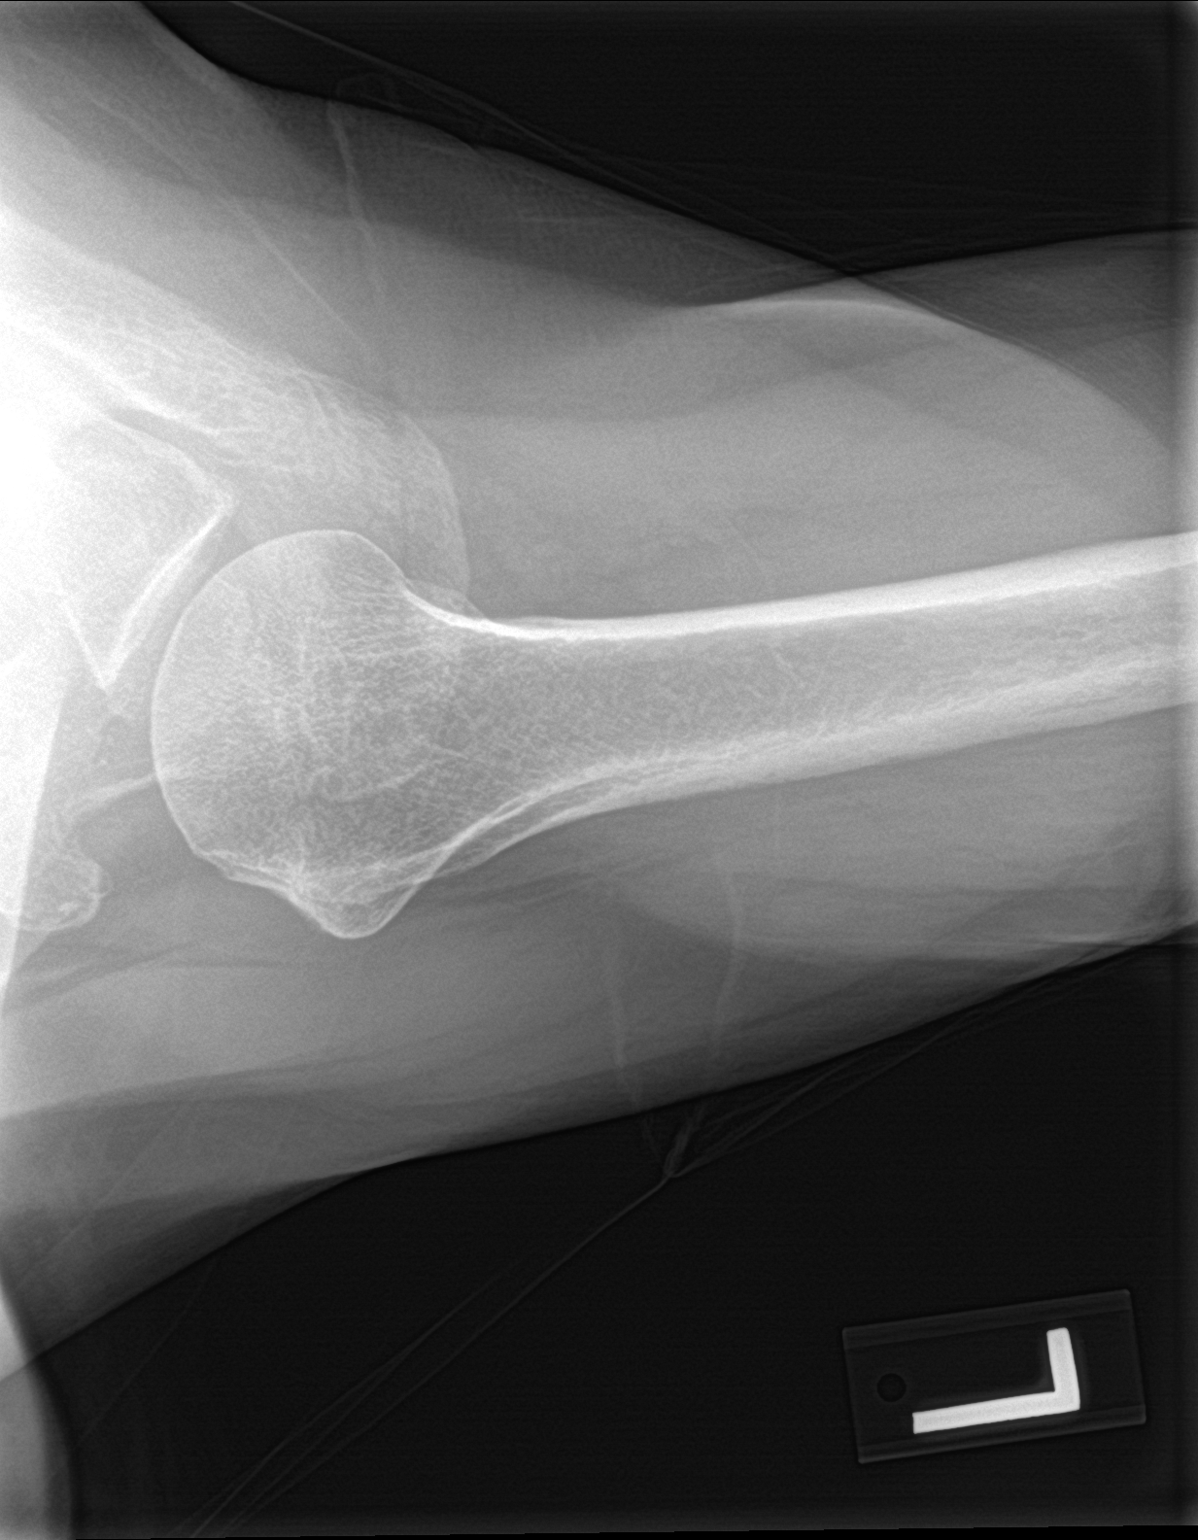

[3 of 3 positions shown; findings below may reference images not displayed]

FINDINGS: Osseous mineralization decreased.

Degenerative changes LEFT AC joint.

Visualized LEFT ribs intact.

No acute fracture, dislocation, or bone destruction.
IMPRESSION: No acute osseous abnormalities.

Degenerative changes LEFT AC joint.

## 2020-07-17 IMAGING — DX DG KNEE 1-2V*R*
3 series · 3 of 3 positions shown · non-contrast
Comparison: None

CLINICAL DATA: Tried to catch a patient as she fell 1.5 weeks ago,
pain in the anterior LEFT shoulder, lateral elbow and posteromedial
knee

EXAM:
RIGHT KNEE - 1-2 VIEW

[knee sunrise]
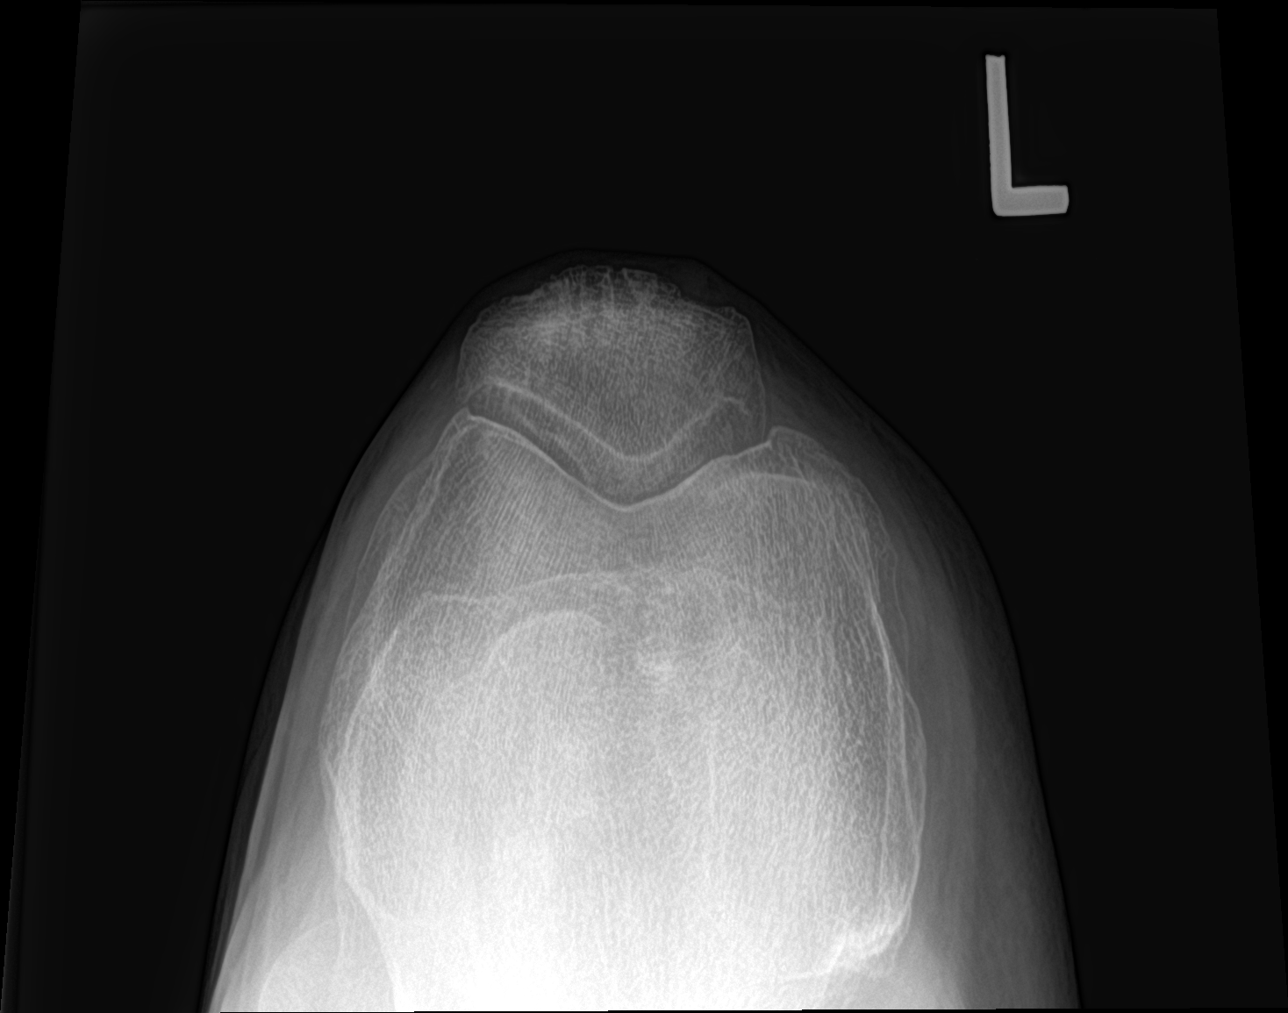

[knee ap bilat standing]
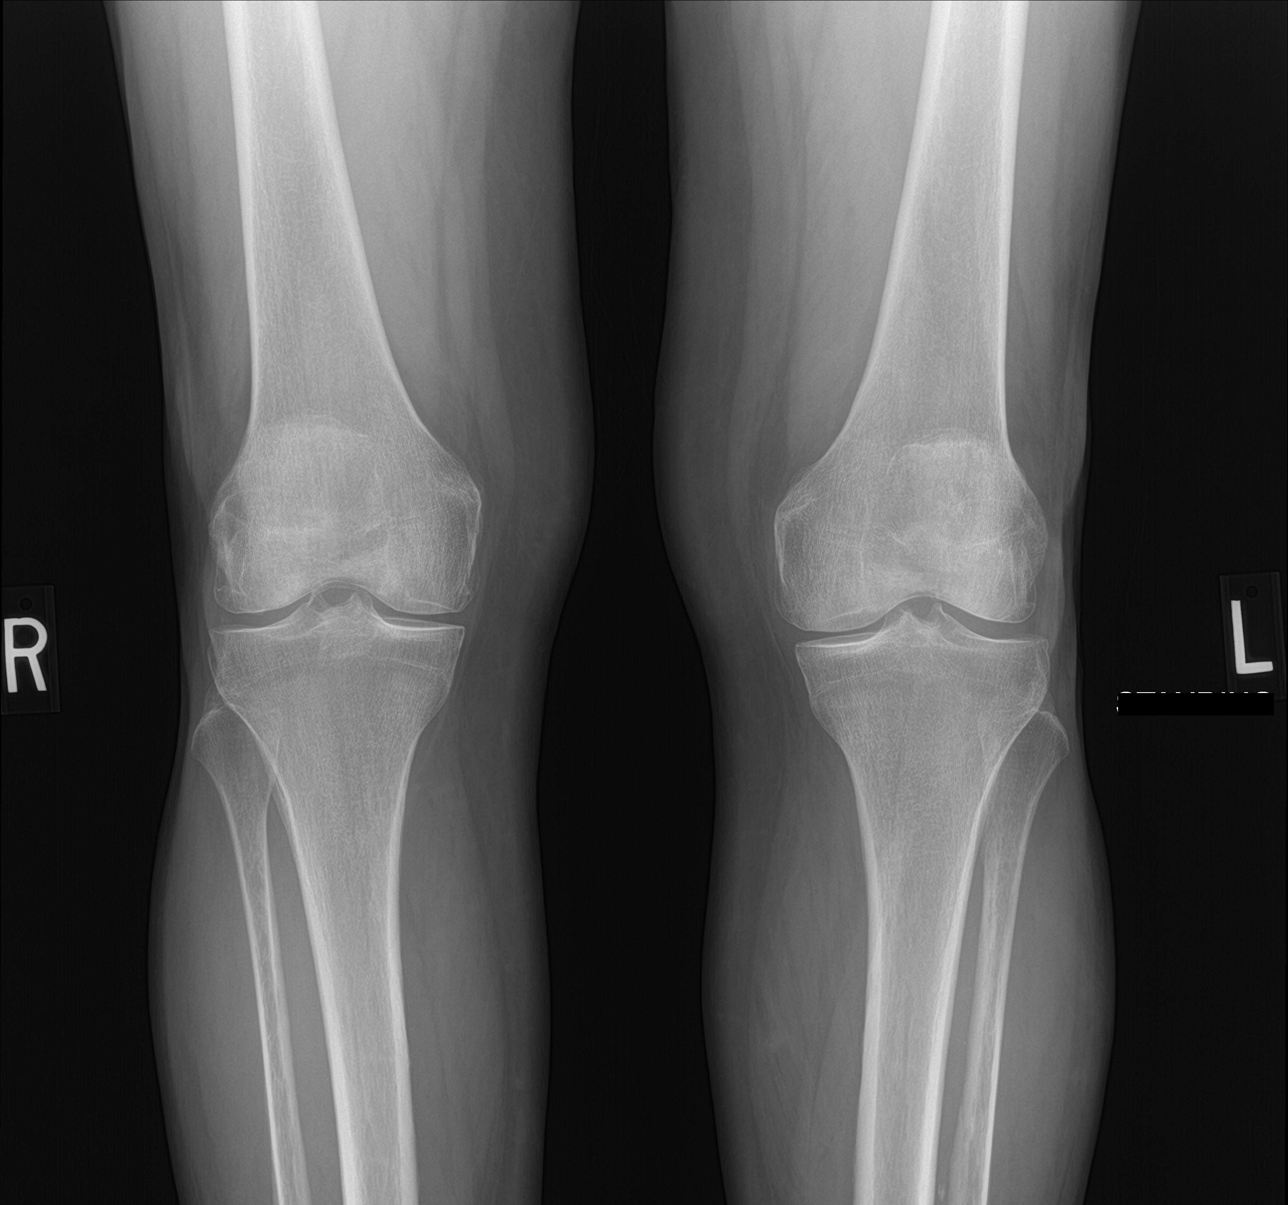

[knee lat]
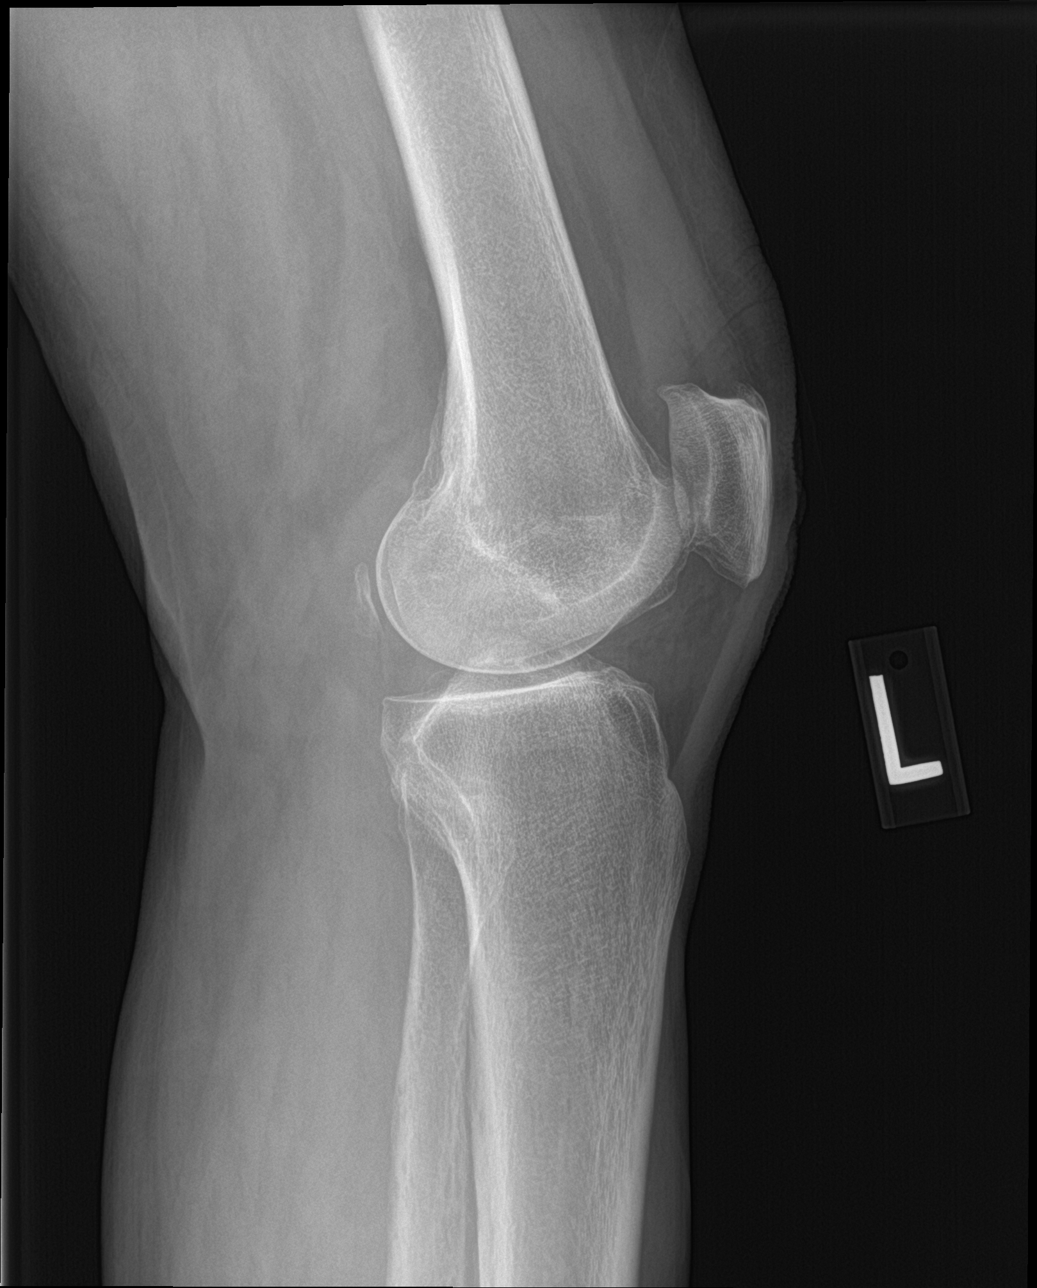

[3 of 3 positions shown; findings below may reference images not displayed]

FINDINGS: Osseous demineralization.

Small knee joint effusion.

Superior patellar spur at quadriceps tendon insertion.

Joint space narrowing greatest at medial compartment.

No acute fracture, dislocation, or bone destruction.

Comparison AP and notch views of the RIGHT knee demonstrate medial
compartment joint space narrowing as well.
IMPRESSION: Osseous demineralization with degenerative changes LEFT knee.

Small LEFT knee joint effusion without acute osseous abnormalities.

## 2021-04-19 DIAGNOSIS — H903 Sensorineural hearing loss, bilateral: Secondary | ICD-10-CM | POA: Insufficient documentation

## 2021-04-19 HISTORY — DX: Sensorineural hearing loss, bilateral: H90.3

## 2022-02-10 ENCOUNTER — Other Ambulatory Visit: Payer: Self-pay | Admitting: Sports Medicine

## 2022-02-10 DIAGNOSIS — M160 Bilateral primary osteoarthritis of hip: Secondary | ICD-10-CM

## 2022-06-04 DIAGNOSIS — H903 Sensorineural hearing loss, bilateral: Secondary | ICD-10-CM | POA: Diagnosis not present

## 2022-06-11 DIAGNOSIS — H903 Sensorineural hearing loss, bilateral: Secondary | ICD-10-CM | POA: Diagnosis not present

## 2022-06-19 ENCOUNTER — Other Ambulatory Visit: Payer: Self-pay | Admitting: Sports Medicine

## 2022-06-19 DIAGNOSIS — M160 Bilateral primary osteoarthritis of hip: Secondary | ICD-10-CM

## 2022-06-27 DIAGNOSIS — H903 Sensorineural hearing loss, bilateral: Secondary | ICD-10-CM | POA: Diagnosis not present

## 2022-08-07 DIAGNOSIS — R002 Palpitations: Secondary | ICD-10-CM | POA: Diagnosis not present

## 2022-08-07 DIAGNOSIS — Z0000 Encounter for general adult medical examination without abnormal findings: Secondary | ICD-10-CM | POA: Diagnosis not present

## 2022-08-07 DIAGNOSIS — Z79899 Other long term (current) drug therapy: Secondary | ICD-10-CM | POA: Diagnosis not present

## 2022-08-11 ENCOUNTER — Encounter: Payer: Self-pay | Admitting: Cardiology

## 2022-08-11 ENCOUNTER — Ambulatory Visit: Payer: PPO | Attending: Cardiology | Admitting: Cardiology

## 2022-08-11 ENCOUNTER — Ambulatory Visit: Payer: PPO | Attending: Cardiology

## 2022-08-11 VITALS — BP 170/108 | HR 74 | Ht 64.0 in | Wt 136.8 lb

## 2022-08-11 DIAGNOSIS — R0609 Other forms of dyspnea: Secondary | ICD-10-CM

## 2022-08-11 DIAGNOSIS — R002 Palpitations: Secondary | ICD-10-CM

## 2022-08-11 DIAGNOSIS — I10 Essential (primary) hypertension: Secondary | ICD-10-CM

## 2022-08-11 DIAGNOSIS — E785 Hyperlipidemia, unspecified: Secondary | ICD-10-CM

## 2022-08-11 NOTE — Progress Notes (Signed)
Cardiology Consultation:    Date:  08/11/2022   ID:  Kristin Rosales, DOB 1956/08/03, MRN [id number]  PCP:  Buckner Malta, MD  Cardiologist:  Gypsy Balsam, MD   Referring MD: Buckner Malta, MD   Chief Complaint  Patient presents with   Palpitations    Ongoing for 1 year    History of Present Illness:    Kristin Rosales is a 66 y.o. female who is being seen today for the evaluation of palpitations at the request of Buckner Malta, MD. past medical history significant for essential hypertension only taking small dose of long-acting propranolol, dyslipidemia.  She was referred to [place] because of episode of palpitations.  For about a year she described to have some skipped beats that happen especially when she is trying to relax in the meantime.  Couple weeks ago she also had a prolonged episode of palpitation her heart was beating very forcefully for couple minutes.  It happened at evening time.  She did not have any chest pain there is no swelling no shortness of breath no dizziness no passing out at this sensation.  Overall she is doing quite well.  She never smoked.  She does have mild elevation of cholesterol does not take any medication for it.  She does not have any family history of premature coronary artery disease however her sister apparently gets some rhythm issues.  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 No pertinent surgical history.  Current Medications: Current Meds  Medication Sig   B Complex Vitamins (VITAMIN-B COMPLEX PO) Take 1 tablet by mouth daily.   CALCIUM-MAGNESIUM-ZINC PO Take 1 tablet by mouth in the morning and at bedtime. 1400 mg-25 mg-600 units   Cholecalciferol (VITAMIN D3) 50 MCG (2000 UT) TABS Take 1 tablet by mouth daily.   meloxicam (MOBIC) 7.5 MG tablet Take 7.5 mg by mouth daily.   OVER THE COUNTER MEDICATION Take 750 mg by mouth daily. Green Lipped Mussel   Probiotic Product (PROBIOTIC DAILY PO) Take 60 Billion Cells by mouth daily.   propranolol ER (INDERAL LA) 80 MG 24 hr capsule Take 80 mg by mouth daily.   Specialty Vitamins Products (COLLAGEN ULTRA PO) Take 3,300 mg by mouth daily.   Vit C-Cholecalciferol-Rose Hip (VITAMIN C & D3/ROSE HIPS PO) Take 1,000 mg by mouth daily.     Allergies:   Insect extract, Penicillins, and Sulfa antibiotics   Social History   Socioeconomic History   Marital status: Married    Spouse name: Not on file   Number of children: Not on file   Years of education: Not on file   Highest education level: Not on file  Occupational History   Not on file  Tobacco Use   Smoking status: Unknown   Smokeless tobacco: Never  Substance and Sexual Activity   Alcohol use: Not on file   Drug use: Not on file   Sexual activity: Not on file  Other Topics Concern   Not on file  Social History Narrative   Not on file   Social Determinants of Health   Financial Resource Strain: Not on file  Food Insecurity: Not on file  Transportation Needs: Not on file  Physical Activity: Not on file  Stress: Not on file  Social Connections: Not on file     Family History: The patient's family history is not on file. ROS:   Please see the history of present illness.    All 14 point review of systems negative  except as described per history of present illness.  EKGs/Labs/Other Studies Reviewed:    The following studies were reviewed today:   EKG:  EKG is  ordered today.  The ekg ordered today demonstrates normal sinus rhythm, normal P interval, normal QS complex duration morphology no ST segment changes.  Recent Labs: No results found for requested labs within last 365 days.  Recent Lipid Panel No results found for: "CHOL", "TRIG", "HDL", "CHOLHDL", "VLDL", "LDLCALC", "LDLDIRECT"  Physical Exam:    VS:  BP (!) 170/108 (BP Location: Left Arm, Patient Position: Sitting)   Pulse 74   Ht 5\' 4"  (1.626 m)   Wt 136 lb 12.8 oz (62.1 kg)   SpO2 98%   BMI 23.48 kg/m     Wt Readings from Last 3 Encounters:  08/11/22 136 lb 12.8 oz (62.1 kg)  03/17/19 145 lb (65.8 kg)  11/20/17 157 lb (71.2 kg)     GEN:  Well nourished, well developed in no acute distress HEENT: Normal NECK: No JVD; No carotid bruits LYMPHATICS: No lymphadenopathy CARDIAC: RRR, no murmurs, no rubs, no gallops RESPIRATORY:  Clear to auscultation without rales, wheezing or rhonchi  ABDOMEN: Soft, non-tender, non-distended MUSCULOSKELETAL:  No edema; No deformity  SKIN: Warm and dry NEUROLOGIC:  Alert and oriented x 3 PSYCHIATRIC:  Normal affect   ASSESSMENT:    1. Palpitations   2. Essential hypertension   3. Dyspnea on exertion   4. Dyslipidemia    PLAN:    In order of problems listed above:  Palpitations: I will ask him to wear Zio patch for 2 weeks to see exactly what can of arrhythmia with dealing with.  I expressed stressed importance of the family team to press the button when she is having some palpitations.  Will continue beta-blocker for now.  Supportive fibrillation echocardiogram will be done to check left ventricle ejection fraction. Essential hypertension blood pressure is uncontrolled and this is first visit to my office.  She tell me when she was in her primary care physician office blood pressure  was normal 125/70.  Therefore, we will continue monitoring, echocardiogram will help with this situation as well because we will be looking for left ventricle hypertrophy.  I do not see evidence of it on EKG. Dyslipidemia her LDL is 122.  I will not act on it.  I in the future we will make an assessment if she truly got hypertension this we will be able to calculate her risk and decide if she need to take any medication for her dyslipidemia.   Medication Adjustments/Labs and Tests Ordered: Current medicines are reviewed at length with the patient today.  Concerns regarding medicines are outlined above.  No orders of the defined types were placed in this encounter.  No orders of the defined  types were placed in this encounter.   Signed, Georgeanna Lea, MD, [facility]. 08/11/2022 3:20 PM    [facility]

## 2022-08-11 NOTE — Patient Instructions (Addendum)
Medication Instructions:  Your physician recommends that you continue on your current medications as directed. Please refer to the Current Medication list given to you today.  *If you need a refill on your cardiac medications before your next appointment, please call your pharmacy*   Lab Work: None Ordered If you have labs (blood work) drawn today and your tests are completely normal, you will receive your results only by: [place] (if you have MyChart) OR A paper copy in the mail If you have any lab test that is abnormal or we need to change your treatment, we will call you to review the results.   Testing/Procedures:  WHY IS MY DOCTOR PRESCRIBING ZIO? The Zio system is proven and trusted by physicians to detect and diagnose irregular heart rhythms -- and has been prescribed to hundreds of thousands of patients.  The FDA has cleared the Zio system to monitor for many different kinds of irregular heart rhythms. In a study, physicians were able to reach a diagnosis 90% of the time with the Zio system1.  You can wear the Zio monitor -- a small, discreet, comfortable patch -- during your normal day-to-day activity, including while you sleep, shower, and exercise, while it records every single heartbeat for analysis.  1Barrett, P., et al. Comparison of 24 Hour Holter Monitoring Versus 14 Day Novel Adhesive Patch Electrocardiographic Monitoring. [place], 2014.  ZIO VS. HOLTER MONITORING The Zio monitor can be comfortably worn for up to 14 days. Holter monitors can be worn for 24 to 48 hours, limiting the time to record any irregular heart rhythms you may have. Zio is able to capture data for the 51% of patients who have their first symptom-triggered arrhythmia after 48 hours.1  LIVE WITHOUT RESTRICTIONS The Zio ambulatory cardiac monitor is a small, unobtrusive, and water-resistant patch--you might even forget you're wearing it. The Zio monitor records and stores  every beat of your heart, whether you're sleeping, working out, or showering.    Your physician has requested that you have an echocardiogram. Echocardiography is a painless test that uses sound waves to create images of your heart. It provides your doctor with information about the size and shape of your heart and how well your heart's chambers and valves are working. This procedure takes approximately one hour. There are no restrictions for this procedure. Please do NOT wear cologne, perfume, aftershave, or lotions (deodorant is allowed). Please arrive 15 minutes prior to your appointment time.    Follow-Up: At [facility], you and your health needs are our priority.  As part of our continuing mission to provide you with exceptional heart care, we have created designated Provider Care Teams.  These Care Teams include your primary Cardiologist (physician) and Advanced Practice Providers (APPs -  Physician Assistants and Nurse Practitioners) who all work together to provide you with the care you need, when you need it.  We recommend signing up for the patient portal called "MyChart".  Sign up information is provided on this After Visit Summary.  MyChart is used to connect with patients for Virtual Visits (Telemedicine).  Patients are able to view lab/test results, encounter notes, upcoming appointments, etc.  Non-urgent messages can be sent to your provider as well.   To learn more about what you can do with MyChart, go to [email or web address].    Your next appointment:   2 month(s)  The format for your next appointment:   In Person  Provider:   Jenne Campus, MD  Other Instructions NA  

## 2022-08-11 NOTE — Addendum Note (Signed)
Addended by: Baldo Ash D on: 08/11/2022 03:39 PM   Modules accepted: Orders

## 2022-08-12 NOTE — Addendum Note (Signed)
Addended by: Roosvelt Harps R on: 08/12/2022 02:46 PM   Modules accepted: Orders

## 2022-08-20 ENCOUNTER — Ambulatory Visit: Payer: PPO | Attending: Cardiology

## 2022-08-20 DIAGNOSIS — R0609 Other forms of dyspnea: Secondary | ICD-10-CM

## 2022-08-20 LAB — ECHOCARDIOGRAM COMPLETE
Area-P 1/2: 3.08 cm2
S' Lateral: 2.4 cm

## 2022-08-21 ENCOUNTER — Telehealth: Payer: Self-pay

## 2022-08-21 NOTE — Telephone Encounter (Signed)
Results reviewed with pt as per Dr. Krasowski's note.  Pt verbalized understanding and had no additional questions. Routed to PCP  

## 2022-09-03 DIAGNOSIS — R002 Palpitations: Secondary | ICD-10-CM | POA: Diagnosis not present

## 2022-09-16 ENCOUNTER — Telehealth: Payer: Self-pay

## 2022-09-16 NOTE — Telephone Encounter (Signed)
Results reviewed with pt as per Dr. Krasowski's note.  Pt verbalized understanding and had no additional questions. Routed to PCP  

## 2022-10-16 ENCOUNTER — Encounter: Payer: Self-pay | Admitting: Cardiology

## 2022-10-16 ENCOUNTER — Ambulatory Visit: Payer: PPO | Attending: Cardiology | Admitting: Cardiology

## 2022-10-16 VITALS — BP 140/84 | HR 60 | Ht 65.0 in | Wt 139.0 lb

## 2022-10-16 DIAGNOSIS — I471 Supraventricular tachycardia, unspecified: Secondary | ICD-10-CM | POA: Insufficient documentation

## 2022-10-16 DIAGNOSIS — I10 Essential (primary) hypertension: Secondary | ICD-10-CM | POA: Diagnosis not present

## 2022-10-16 DIAGNOSIS — E785 Hyperlipidemia, unspecified: Secondary | ICD-10-CM

## 2022-10-16 DIAGNOSIS — R0609 Other forms of dyspnea: Secondary | ICD-10-CM | POA: Diagnosis not present

## 2022-10-16 MED ORDER — METOPROLOL SUCCINATE ER 50 MG PO TB24: 50.0000 mg | ORAL_TABLET | Freq: Every day | ORAL | 3 refills | Status: AC

## 2022-10-16 NOTE — Patient Instructions (Addendum)
Medication Instructions:   Stop Inderal  Start Metoprolol Succinate 50 mg daily  *If you need a refill on your cardiac medications before your next appointment, please call your pharmacy*  Lab Work: None  If you have labs (blood work) drawn today and your tests are completely normal, you will receive your results only by: [place] (if you have MyChart) OR A paper copy in the mail If you have any lab test that is abnormal or we need to change your treatment, we will call you to review the results.   Testing/Procedures: Your physician has recommend you to have a coronary calcium score. This is a self pay test that will cost $99    Follow-Up: At [facility], you and your health needs are our priority.  As part of our continuing mission to provide you with exceptional heart care, we have created designated Provider Care Teams.  These Care Teams include your primary Cardiologist (physician) and Advanced Practice Providers (APPs -  Physician Assistants and Nurse Practitioners) who all work together to provide you with the care you need, when you need it. We recommend signing up for the patient portal called "MyChart".  Sign up information is provided on this After Visit Summary.  MyChart is used to connect with patients for Virtual Visits (Telemedicine).  Patients are able to view lab/test results, encounter notes, upcoming appointments, etc.  Non-urgent messages can be sent to your provider as well.   To learn more about what you can do with MyChart, go to [email or web address].    Next appt: 6 months   Provider:   Venia Carbon, NP [facility])  Other Instructions   We will order CT coronary calcium score. It will cost $99.00 and is not covered by insurance.  Please call to schedule.    [facility]  [address] [place]  [place], [place] [address] [phone]            Or [facility] [address] [place], [place] [address] [phone]

## 2022-10-16 NOTE — Progress Notes (Signed)
Cardiology Office Note:    Date:  10/16/2022   ID:  Kristin Rosales, DOB [birth year], MRN [id number]  PCP:  Serita Grammes, MD  Cardiologist:  Jenne Campus, MD    Referring MD: Serita Grammes, MD   Chief Complaint  Patient presents with   Follow-up    History of Present Illness:    Kristin Rosales is a 67 y.o. female with past medical history significant for dyslipidemia, essential hypertension, she was referred to [place] because of palpitations, she wore monitor monitor showed evidence of supraventricular tachycardia, short episodes.  Overall she is doing fine but she still complain of having some palpitations from time to time.  It bothers her to the point that she would like to have something done about it.  She is taking propranolol already long-acting for many years she is not sure exactly why she is taking it she is thinking it is for high blood pressure I will switch her from propranolol to metoprolol.  She also brought an issue of multiple family members with premature coronary artery disease.  She would like to know if she has any problem.  Past Medical History:  Diagnosis Date   Abrasion of right ear canal 06/22/2017   Cellulitis of left lower extremity 03/25/2015   Essential hypertension 03/25/2015   Injury of left shoulder 12/26/2019   Left knee injury 12/26/2019   Left lumbar radiculitis 02/03/2020   Leg swelling 08/14/2014   Nausea 03/25/2015   Pain of left lower extremity 03/25/2015   Pain of right sacroiliac joint 11/20/2017   Primary osteoarthritis of both hips 01/19/2015   Primary osteoarthritis of both knees 02/21/2015   Primary osteoarthritis of left elbow 12/12/2019   Sensorineural hearing loss (SNHL) of both ears 04/19/2021   Spider veins 08/14/2014   Trochanteric bursitis of both hips 12/26/2019   Varicose vein of leg 08/14/2014    History reviewed. No pertinent surgical history.  Current Medications: Current Meds  Medication Sig   B Complex Vitamins  (VITAMIN-B COMPLEX PO) Take 1 tablet by mouth daily.   CALCIUM-MAGNESIUM-ZINC PO Take 1 tablet by mouth in the morning and at bedtime. 1400 mg-25 mg-600 units   Cholecalciferol (VITAMIN D3) 50 MCG (2000 UT) TABS Take 1 tablet by mouth daily.   meloxicam (MOBIC) 7.5 MG tablet Take 7.5 mg by mouth daily.   OVER THE COUNTER MEDICATION Take 750 mg by mouth daily. Green Lipped Mussel   Probiotic Product (PROBIOTIC DAILY PO) Take 60 Billion Cells by mouth daily.   propranolol ER (INDERAL LA) 80 MG 24 hr capsule Take 80 mg by mouth daily.   Specialty Vitamins Products (COLLAGEN ULTRA PO) Take 3,300 mg by mouth daily.   Vit C-Cholecalciferol-Rose Hip (VITAMIN C & D3/ROSE HIPS PO) Take 1,000 mg by mouth daily.     Allergies:   Insect extract, Penicillins, and Sulfa antibiotics   Social History   Socioeconomic History   Marital status: Married    Spouse name: Not on file   Number of children: Not on file   Years of education: Not on file   Highest education level: Not on file  Occupational History   Not on file  Tobacco Use   Smoking status: Unknown   Smokeless tobacco: Never  Substance and Sexual Activity   Alcohol use: Not on file   Drug use: Not on file   Sexual activity: Not on file  Other Topics Concern   Not on file  Social History Narrative   Not on file  Social Determinants of Health   Financial Resource Strain: Not on file  Food Insecurity: Not on file  Transportation Needs: Not on file  Physical Activity: Not on file  Stress: Not on file  Social Connections: Not on file     Family History: The patient's family history is not on file. ROS:   Please see the history of present illness.    All 14 point review of systems negative except as described per history of present illness  EKGs/Labs/Other Studies Reviewed:      Recent Labs: No results found for requested labs within last 365 days.  Recent Lipid Panel No results found for: "CHOL", "TRIG", "HDL", "CHOLHDL",  "VLDL", "LDLCALC", "LDLDIRECT"  Physical Exam:    VS:  BP (!) 140/84 (BP Location: Left Arm, Patient Position: Sitting)   Pulse 60   Ht 5' 5"$  (1.651 m)   Wt 139 lb (63 kg)   SpO2 93%   BMI 23.13 kg/m     Wt Readings from Last 3 Encounters:  10/16/22 139 lb (63 kg)  08/11/22 136 lb 12.8 oz (62.1 kg)  03/17/19 145 lb (65.8 kg)     GEN:  Well nourished, well developed in no acute distress HEENT: Normal NECK: No JVD; No carotid bruits LYMPHATICS: No lymphadenopathy CARDIAC: RRR, no murmurs, no rubs, no gallops RESPIRATORY:  Clear to auscultation without rales, wheezing or rhonchi  ABDOMEN: Soft, non-tender, non-distended MUSCULOSKELETAL:  No edema; No deformity  SKIN: Warm and dry LOWER EXTREMITIES: no swelling NEUROLOGIC:  Alert and oriented x 3 PSYCHIATRIC:  Normal affect   ASSESSMENT:    1. Supraventricular tachycardia   2. Essential hypertension   3. Dyspnea on exertion   4. Dyslipidemia    PLAN:    In order of problems listed above:  Supraventricular tachycardia which is new diagnosis.  Will switch her to metoprolol succinate 50 mg daily.  Will discontinue her propranolol Essential hypertension: She will be better managed with metoprolol.  Will continue monitoring. She brought an issue of family history of premature coronary artery disease she would like to have better stratification for risk factors, therefore, I will ask her to have calcium score done. Dyslipidemia I did review her K PN will make a decision about treatment of her cholesterol based on results of calcium score   Medication Adjustments/Labs and Tests Ordered: Current medicines are reviewed at length with the patient today.  Concerns regarding medicines are outlined above.  No orders of the defined types were placed in this encounter.  Medication changes: No orders of the defined types were placed in this encounter.   Signed, Park Liter, MD, [facility] 10/16/2022 5:06 PM    [facility]

## 2022-11-07 ENCOUNTER — Telehealth: Payer: Self-pay | Admitting: Cardiology

## 2022-11-07 NOTE — Addendum Note (Signed)
Addended by: Jacobo Forest D on: 11/07/2022 02:08 PM   Modules accepted: Orders

## 2022-11-07 NOTE — Telephone Encounter (Signed)
Pt is calling to see about a test she was suppose to have scheduled to check calcium in arteries she states. Requesting return call.

## 2022-11-12 ENCOUNTER — Ambulatory Visit (HOSPITAL_BASED_OUTPATIENT_CLINIC_OR_DEPARTMENT_OTHER)
Admission: RE | Admit: 2022-11-12 | Discharge: 2022-11-12 | Disposition: A | Discharge status: Home / Self Care | Payer: Self-pay | Source: Ambulatory Visit | Attending: Cardiology | Admitting: Cardiology

## 2022-11-12 DIAGNOSIS — I471 Supraventricular tachycardia, unspecified: Secondary | ICD-10-CM | POA: Insufficient documentation

## 2022-11-18 ENCOUNTER — Telehealth: Payer: Self-pay | Admitting: Cardiology

## 2022-11-18 ENCOUNTER — Telehealth: Payer: Self-pay

## 2022-11-18 NOTE — Telephone Encounter (Signed)
-----   Message from Park Liter, MD sent at 11/17/2022 12:45 PM EDT ----- Calcium score is very low, only 12.2 which is 61% remaining 61% of people her age sex and race have worse score that she does not.  CT of the chest however shows some more multiple pulmonary nodules that need to be followed by primary care physician

## 2022-11-18 NOTE — Telephone Encounter (Signed)
Patient is returning call for results. Transferred to [place], [place]

## 2022-11-18 NOTE — Telephone Encounter (Signed)
Spoke with patient aware of CT results and recommendations to f/u for multiple pulmonary nodules. Result and appt request fax to PCP. Advise patient to contact PCP in a few days, if she did not hear from PCP.

## 2022-12-01 DIAGNOSIS — R931 Abnormal findings on diagnostic imaging of heart and coronary circulation: Secondary | ICD-10-CM | POA: Diagnosis not present

## 2022-12-01 DIAGNOSIS — I10 Essential (primary) hypertension: Secondary | ICD-10-CM | POA: Diagnosis not present

## 2022-12-01 DIAGNOSIS — Z6823 Body mass index (BMI) 23.0-23.9, adult: Secondary | ICD-10-CM | POA: Diagnosis not present

## 2022-12-01 DIAGNOSIS — N951 Menopausal and female climacteric states: Secondary | ICD-10-CM | POA: Diagnosis not present

## 2022-12-01 DIAGNOSIS — W57XXXA Bitten or stung by nonvenomous insect and other nonvenomous arthropods, initial encounter: Secondary | ICD-10-CM | POA: Diagnosis not present

## 2022-12-01 DIAGNOSIS — J309 Allergic rhinitis, unspecified: Secondary | ICD-10-CM | POA: Diagnosis not present

## 2022-12-01 DIAGNOSIS — E785 Hyperlipidemia, unspecified: Secondary | ICD-10-CM | POA: Diagnosis not present

## 2022-12-01 DIAGNOSIS — M7582 Other shoulder lesions, left shoulder: Secondary | ICD-10-CM | POA: Diagnosis not present

## 2022-12-29 DIAGNOSIS — J069 Acute upper respiratory infection, unspecified: Secondary | ICD-10-CM | POA: Diagnosis not present

## 2022-12-29 DIAGNOSIS — Z6823 Body mass index (BMI) 23.0-23.9, adult: Secondary | ICD-10-CM | POA: Diagnosis not present

## 2022-12-29 DIAGNOSIS — R509 Fever, unspecified: Secondary | ICD-10-CM | POA: Diagnosis not present

## 2023-01-14 DIAGNOSIS — Z1339 Encounter for screening examination for other mental health and behavioral disorders: Secondary | ICD-10-CM | POA: Diagnosis not present

## 2023-01-14 DIAGNOSIS — R7302 Impaired glucose tolerance (oral): Secondary | ICD-10-CM | POA: Diagnosis not present

## 2023-01-14 DIAGNOSIS — J309 Allergic rhinitis, unspecified: Secondary | ICD-10-CM | POA: Diagnosis not present

## 2023-01-14 DIAGNOSIS — Z79899 Other long term (current) drug therapy: Secondary | ICD-10-CM | POA: Diagnosis not present

## 2023-01-14 DIAGNOSIS — I10 Essential (primary) hypertension: Secondary | ICD-10-CM | POA: Diagnosis not present

## 2023-01-14 DIAGNOSIS — R051 Acute cough: Secondary | ICD-10-CM | POA: Diagnosis not present

## 2023-01-14 DIAGNOSIS — Z0000 Encounter for general adult medical examination without abnormal findings: Secondary | ICD-10-CM | POA: Diagnosis not present

## 2023-01-14 DIAGNOSIS — R931 Abnormal findings on diagnostic imaging of heart and coronary circulation: Secondary | ICD-10-CM | POA: Diagnosis not present

## 2023-01-14 DIAGNOSIS — E785 Hyperlipidemia, unspecified: Secondary | ICD-10-CM | POA: Diagnosis not present

## 2023-01-14 DIAGNOSIS — Z6824 Body mass index (BMI) 24.0-24.9, adult: Secondary | ICD-10-CM | POA: Diagnosis not present

## 2023-05-13 ENCOUNTER — Encounter: Payer: PPO | Admitting: Sports Medicine

## 2023-05-14 ENCOUNTER — Encounter: Payer: Self-pay | Admitting: Sports Medicine

## 2023-05-14 ENCOUNTER — Ambulatory Visit (INDEPENDENT_AMBULATORY_CARE_PROVIDER_SITE_OTHER): Payer: PPO

## 2023-05-14 ENCOUNTER — Ambulatory Visit (INDEPENDENT_AMBULATORY_CARE_PROVIDER_SITE_OTHER): Payer: PPO | Admitting: Sports Medicine

## 2023-05-14 DIAGNOSIS — M160 Bilateral primary osteoarthritis of hip: Secondary | ICD-10-CM

## 2023-05-14 DIAGNOSIS — M7542 Impingement syndrome of left shoulder: Secondary | ICD-10-CM

## 2023-05-14 DIAGNOSIS — M7541 Impingement syndrome of right shoulder: Secondary | ICD-10-CM | POA: Diagnosis not present

## 2023-05-14 DIAGNOSIS — M19011 Primary osteoarthritis, right shoulder: Secondary | ICD-10-CM | POA: Diagnosis not present

## 2023-05-14 DIAGNOSIS — M25511 Pain in right shoulder: Secondary | ICD-10-CM | POA: Diagnosis not present

## 2023-05-14 DIAGNOSIS — M25512 Pain in left shoulder: Secondary | ICD-10-CM | POA: Diagnosis not present

## 2023-05-14 DIAGNOSIS — M19012 Primary osteoarthritis, left shoulder: Secondary | ICD-10-CM | POA: Diagnosis not present

## 2023-05-14 DIAGNOSIS — M25552 Pain in left hip: Secondary | ICD-10-CM | POA: Diagnosis not present

## 2023-05-14 MED ORDER — CELECOXIB 200 MG PO CAPS: ORAL_CAPSULE | ORAL | 2 refills | Status: DC

## 2023-05-14 MED ORDER — TRAMADOL HCL 50 MG PO TABS: 50.0000 mg | ORAL_TABLET | Freq: Three times a day (TID) | ORAL | 0 refills | Status: DC | PRN

## 2023-05-14 MED ORDER — PREDNISONE 50 MG PO TABS: ORAL_TABLET | ORAL | 0 refills | Status: DC

## 2023-05-14 NOTE — Assessment & Plan Note (Signed)
Bilateral left worse than right shoulder pain, injection back in 2021 did provide some relief, because she is having such widespread pain we will do a burst of steroids, x-rays, home physical therapy, switching from meloxicam to Celebrex, return to see me in 6 weeks, injections if not better.

## 2023-05-14 NOTE — Progress Notes (Signed)
    Procedures performed today:    None.  Independent interpretation of notes and tests performed by another provider:   None.  Brief History, Exam, Impression, and Recommendations:    Primary osteoarthritis of both hips Known bilateral hip osteoarthritis, last injected 2021, did not get much relief, now having recurrence of discomfort, pain with internal rotation consistent with a hip joint pain generator. Adding 5 days of prednisone followed by Celebrex, tramadol for breakthrough pain, bilateral x-rays and home physical therapy. Return to 6 weeks, we will try injections again if not better, if this fails she will need MRIs.  Impingement syndrome of both shoulders Bilateral left worse than right shoulder pain, injection back in 2021 did provide some relief, because she is having such widespread pain we will do a burst of steroids, x-rays, home physical therapy, switching from meloxicam to Celebrex, return to see me in 6 weeks, injections if not better.    ____________________________________________ Ihor Austin. Benjamin Stain, M.D., ABFM., CAQSM., AME. Primary Care and Sports Medicine [facility]  Adjunct Professor of Family Medicine  [employer]  Restaurant manager, fast food

## 2023-05-14 NOTE — Assessment & Plan Note (Signed)
Known bilateral hip osteoarthritis, last injected 2021, did not get much relief, now having recurrence of discomfort, pain with internal rotation consistent with a hip joint pain generator. Adding 5 days of prednisone followed by Celebrex, tramadol for breakthrough pain, bilateral x-rays and home physical therapy. Return to 6 weeks, we will try injections again if not better, if this fails she will need MRIs.

## 2023-05-21 ENCOUNTER — Other Ambulatory Visit: Payer: Self-pay | Admitting: Sports Medicine

## 2023-05-21 DIAGNOSIS — M7541 Impingement syndrome of right shoulder: Secondary | ICD-10-CM

## 2023-05-21 MED ORDER — PREDNISONE 10 MG (48) PO TBPK
ORAL_TABLET | Freq: Every day | ORAL | 0 refills | Status: AC
Start: 2023-05-21 — End: ?

## 2023-06-23 ENCOUNTER — Ambulatory Visit: Payer: PPO | Admitting: Cardiology

## 2023-06-25 ENCOUNTER — Ambulatory Visit: Payer: PPO | Admitting: Sports Medicine

## 2023-06-29 ENCOUNTER — Ambulatory Visit: Payer: PPO | Admitting: Sports Medicine

## 2023-07-03 ENCOUNTER — Ambulatory Visit: Payer: PPO | Admitting: Sports Medicine

## 2023-07-06 ENCOUNTER — Ambulatory Visit: Payer: PPO | Admitting: Sports Medicine

## 2023-07-09 ENCOUNTER — Ambulatory Visit: Payer: PPO | Attending: Cardiology | Admitting: Cardiology

## 2023-07-09 ENCOUNTER — Encounter: Payer: Self-pay | Admitting: Cardiology

## 2023-07-09 VITALS — BP 120/82 | HR 59 | Ht 65.0 in | Wt 145.2 lb

## 2023-07-09 DIAGNOSIS — I471 Supraventricular tachycardia, unspecified: Secondary | ICD-10-CM | POA: Diagnosis not present

## 2023-07-09 DIAGNOSIS — E785 Hyperlipidemia, unspecified: Secondary | ICD-10-CM

## 2023-07-09 DIAGNOSIS — I10 Essential (primary) hypertension: Secondary | ICD-10-CM | POA: Diagnosis not present

## 2023-07-09 DIAGNOSIS — R002 Palpitations: Secondary | ICD-10-CM | POA: Diagnosis not present

## 2023-07-09 NOTE — Addendum Note (Signed)
Addended by: Baldo Ash D on: 07/09/2023 09:43 AM   Modules accepted: Orders

## 2023-07-09 NOTE — Progress Notes (Signed)
Cardiology Office Note:    Date:  07/09/2023   ID:  Honey Zakarian, DOB 06/30/1956, MRN [id number]  PCP:  Buckner Malta, MD  Cardiologist:  Gypsy Balsam, MD    Referring MD: Buckner Malta, MD   Chief Complaint  Patient presents with   Medication Management    History of Present Illness:    Kristin Rosales is a 67 y.o. female past medical history significant for dyslipidemia, essential hypertension, supraventricular tachycardia.  Comes today to my office for follow-up overall seems to be doing well.  I put her last time on metoprolol and switch from Toprol to metoprolol she said palpitation are much less frequent which is very happy liver issues.  She denies have any chest pain tightness squeezing pressure burning chest.  Last time she expressed her concern about multiple family members having heart issues did coronary calcium score which was 12.2 which puts her in 61st percentile.  She is here to talk about this.  She is asymptomatic denies have any chest pain tightness squeezing pressure burning chest  Past Medical History:  Diagnosis Date   Abrasion of right ear canal 06/22/2017   Cellulitis of left lower extremity 03/25/2015   Essential hypertension 03/25/2015   Injury of left shoulder 12/26/2019   Left knee injury 12/26/2019   Left lumbar radiculitis 02/03/2020   Leg swelling 08/14/2014   Nausea 03/25/2015   Pain of left lower extremity 03/25/2015   Pain of right sacroiliac joint 11/20/2017   Primary osteoarthritis of both hips 01/19/2015   Primary osteoarthritis of both knees 02/21/2015   Primary osteoarthritis of left elbow 12/12/2019   Sensorineural hearing loss (SNHL) of both ears 04/19/2021   Spider veins 08/14/2014   Trochanteric bursitis of both hips 12/26/2019   Varicose vein of leg 08/14/2014    History reviewed. No pertinent surgical history.  Current Medications: Current Meds  Medication Sig   B Complex Vitamins (VITAMIN-B COMPLEX PO) Take 1 tablet  by mouth daily.   CALCIUM-MAGNESIUM-ZINC PO Take 1 tablet by mouth in the morning and at bedtime. 1400 mg-25 mg-600 units   celecoxib (CELEBREX) 200 MG capsule One to 2 tablets by mouth daily as needed for pain. (Patient taking differently: Take 200 mg by mouth See admin instructions. One to 2 tablets by mouth daily as needed for pain.)   Cholecalciferol (VITAMIN D3) 50 MCG (2000 UT) TABS Take 1 tablet by mouth daily.   metoprolol succinate (TOPROL-XL) 50 MG 24 hr tablet Take 1 tablet (50 mg total) by mouth daily. Take with or immediately following a meal.   OVER THE COUNTER MEDICATION Take 750 mg by mouth daily. Green Lipped Mussel   predniSONE (STERAPRED UNI-PAK 48 TAB) 10 MG (48) TBPK tablet Take by mouth daily. 12-day taper pack, use as directed for taper (Patient taking differently: Take 1 tablet by mouth daily. 12-day taper pack, use as directed for taper)   Probiotic Product (PROBIOTIC DAILY PO) Take 60 Billion Cells by mouth daily.   Specialty Vitamins Products (COLLAGEN ULTRA PO) Take 3,300 mg by mouth daily.   traMADol (ULTRAM) 50 MG tablet Take 1 tablet (50 mg total) by mouth every 8 (eight) hours as needed for moderate pain.   Vit C-Cholecalciferol-Rose Hip (VITAMIN C & D3/ROSE HIPS PO) Take 1,000 mg by mouth daily.     Allergies:   Insect extract, Penicillins, and Sulfa antibiotics   Social History   Socioeconomic History   Marital status: Married    Spouse name: Not on file  Number of children: Not on file   Years of education: Not on file   Highest education level: Not on file  Occupational History   Not on file  Tobacco Use   Smoking status: Unknown   Smokeless tobacco: Never  Substance and Sexual Activity   Alcohol use: Not on file   Drug use: Not on file   Sexual activity: Not on file  Other Topics Concern   Not on file  Social History Narrative   Not on file   Social Determinants of Health   Financial Resource Strain: Not on file  Food Insecurity: Not on  file  Transportation Needs: Not on file  Physical Activity: Not on file  Stress: Not on file  Social Connections: Unknown (01/11/2022)   [facility] from [facility]   Social Network    Social Network: Not on file     Family History: The patient's family history is not on file. ROS:   Please see the history of present illness.    All 14 point review of systems negative except as described per history of present illness  EKGs/Labs/Other Studies Reviewed:    EKG Interpretation Date/Time:  Thursday July 09 2023 09:12:47 EST Ventricular Rate:  59 PR Interval:  178 QRS Duration:  78 QT Interval:  398 QTC Calculation: 394 R Axis:   6  Text Interpretation: Sinus bradycardia Low voltage QRS Borderline ECG No previous ECGs available Confirmed by Gypsy Balsam [phone]) on 07/09/2023 9:18:29 AM    Recent Labs: No results found for requested labs within last 365 days.  Recent Lipid Panel No results found for: "CHOL", "TRIG", "HDL", "CHOLHDL", "VLDL", "LDLCALC", "LDLDIRECT"  Physical Exam:    VS:  BP 120/82 (BP Location: Left Arm, Patient Position: Sitting)   Pulse (!) 59   Ht 5\' 5"  (1.651 m)   Wt 145 lb 3.2 oz (65.9 kg)   SpO2 98%   BMI 24.16 kg/m     Wt Readings from Last 3 Encounters:  07/09/23 145 lb 3.2 oz (65.9 kg)  10/16/22 139 lb (63 kg)  08/11/22 136 lb 12.8 oz (62.1 kg)     GEN:  Well nourished, well developed in no acute distress HEENT: Normal NECK: No JVD; No carotid bruits LYMPHATICS: No lymphadenopathy CARDIAC: RRR, no murmurs, no rubs, no gallops RESPIRATORY:  Clear to auscultation without rales, wheezing or rhonchi  ABDOMEN: Soft, non-tender, non-distended MUSCULOSKELETAL:  No edema; No deformity  SKIN: Warm and dry LOWER EXTREMITIES: no swelling NEUROLOGIC:  Alert and oriented x 3 PSYCHIATRIC:  Normal affect   ASSESSMENT:    1. Supraventricular tachycardia (HCC)   2. Essential hypertension   3. Dyslipidemia   4. Palpitations     PLAN:    In order of problems listed above:  Supraventricular tachycardia.  Patient is stable on appropriate medications.  I told her if that became bothersome she need to let me know then probably will add some calcium channel blocker because she is already getting slightly bradycardic on beta-blocker. Essential hypertension: Blood pressure well-controlled. Dyslipidemia I did review K PN which show total cholesterol 200 HDL 47.  She was put on some medication by her primary care physician that she takes twice a week I suspect this is Crestor I will check her fasting lipid profile today.  She is fasting.  I did calculate her 10 years risk which is 4.3% her coronary age is 60 which is +3.  I explained to her significance of this.   Medication  Adjustments/Labs and Tests Ordered: Current medicines are reviewed at length with the patient today.  Concerns regarding medicines are outlined above.  Orders Placed This Encounter  Procedures   EKG 12-Lead   Medication changes: No orders of the defined types were placed in this encounter.   Signed, Georgeanna Lea, MD, [facility] 07/09/2023 9:31 AM    [facility]

## 2023-07-09 NOTE — Patient Instructions (Signed)

## 2023-07-11 LAB — LIPID PANEL
Chol/HDL Ratio: 3.6 ratio (ref 0.0–4.4)
Cholesterol, Total: 176 mg/dL (ref 100–199)
HDL: 49 mg/dL (ref >39)
LDL Chol Calc (NIH): 108 mg/dL — ABNORMAL HIGH (ref 0–99)
Triglycerides: 105 mg/dL (ref 0–149)
VLDL Cholesterol Cal: 19 mg/dL (ref 5–40)

## 2023-07-11 LAB — AST: AST: 31 [IU]/L (ref 0–40)

## 2023-07-11 LAB — ALT: ALT: 20 [IU]/L (ref 0–32)

## 2023-07-11 LAB — LIPOPROTEIN A (LPA): Lipoprotein (a): 38.3 nmol/L (ref <75.0)

## 2023-07-17 ENCOUNTER — Telehealth: Payer: Self-pay

## 2023-07-17 NOTE — Telephone Encounter (Signed)
Left message on My Chart with normal Lab results per Dr. Vanetta Shawl note. Routed to PCP.

## 2023-07-21 ENCOUNTER — Ambulatory Visit: Payer: PPO | Admitting: Sports Medicine

## 2023-07-22 DIAGNOSIS — Z6825 Body mass index (BMI) 25.0-25.9, adult: Secondary | ICD-10-CM | POA: Diagnosis not present

## 2023-07-22 DIAGNOSIS — D649 Anemia, unspecified: Secondary | ICD-10-CM | POA: Diagnosis not present

## 2023-07-22 DIAGNOSIS — H18891 Other specified disorders of cornea, right eye: Secondary | ICD-10-CM | POA: Diagnosis not present

## 2023-07-22 DIAGNOSIS — N951 Menopausal and female climacteric states: Secondary | ICD-10-CM | POA: Diagnosis not present

## 2023-07-22 DIAGNOSIS — I10 Essential (primary) hypertension: Secondary | ICD-10-CM | POA: Diagnosis not present

## 2023-07-22 DIAGNOSIS — E785 Hyperlipidemia, unspecified: Secondary | ICD-10-CM | POA: Diagnosis not present

## 2023-07-28 ENCOUNTER — Ambulatory Visit: Payer: PPO | Admitting: Sports Medicine

## 2023-08-04 ENCOUNTER — Ambulatory Visit (INDEPENDENT_AMBULATORY_CARE_PROVIDER_SITE_OTHER): Payer: PPO | Admitting: Sports Medicine

## 2023-08-04 ENCOUNTER — Encounter: Payer: Self-pay | Admitting: Sports Medicine

## 2023-08-04 DIAGNOSIS — M160 Bilateral primary osteoarthritis of hip: Secondary | ICD-10-CM

## 2023-08-04 DIAGNOSIS — M2550 Pain in unspecified joint: Secondary | ICD-10-CM

## 2023-08-04 MED ORDER — ACETAMINOPHEN ER 650 MG PO TBCR: 650.0000 mg | EXTENDED_RELEASE_TABLET | Freq: Two times a day (BID) | ORAL | Status: AC

## 2023-08-04 MED ORDER — TRAMADOL HCL 50 MG PO TABS: 50.0000 mg | ORAL_TABLET | Freq: Three times a day (TID) | ORAL | 0 refills | Status: AC | PRN

## 2023-08-04 NOTE — Progress Notes (Signed)
    Procedures performed today:    None.  Independent interpretation of notes and tests performed by another provider:   None.  Brief History, Exam, Impression, and Recommendations:    Polyarthralgia Kristin Rosales returns, she has osteoarthritis in multiple joints, glenohumeral, femoroacetabular, bilateral hands. She has done some home conditioning, she is taking Celebrex, unfortunately continues to have discomfort. I explained to her the incurable nature of osteoarthritis, she understands that we need to try to find a good medical regimen for her, she will continue Celebrex, adding arthritis from Tylenol, continue topical Voltaren, we can do about #20 tramadol per month at the very most though she understands to limit use. I would also like her to do a Mediterranean type anti-inflammatory diet. I will see her back in 4 to 6 weeks, if insufficient improvement we will consider the addition of neuropathic agents.    ____________________________________________ Ihor Austin. Benjamin Stain, M.D., ABFM., CAQSM., AME. Primary Care and Sports Medicine [facility]  Adjunct Professor of Family Medicine  [employer] [facility] [employer] [facility] [employer]  Restaurant manager, fast food

## 2023-08-04 NOTE — Assessment & Plan Note (Signed)
Kd returns, she has osteoarthritis in multiple joints, glenohumeral, femoroacetabular, bilateral hands. She has done some home conditioning, she is taking Celebrex, unfortunately continues to have discomfort. I explained to her the incurable nature of osteoarthritis, she understands that we need to try to find a good medical regimen for her, she will continue Celebrex, adding arthritis from Tylenol, continue topical Voltaren, we can do about #20 tramadol per month at the very most though she understands to limit use. I would also like her to do a Mediterranean type anti-inflammatory diet. I will see her back in 4 to 6 weeks, if insufficient improvement we will consider the addition of neuropathic agents.

## 2023-08-04 NOTE — Patient Instructions (Signed)

## 2023-08-23 ENCOUNTER — Other Ambulatory Visit: Payer: Self-pay | Admitting: Sports Medicine

## 2023-08-23 DIAGNOSIS — M160 Bilateral primary osteoarthritis of hip: Secondary | ICD-10-CM

## 2023-09-15 ENCOUNTER — Ambulatory Visit: Payer: PPO | Admitting: Sports Medicine

## 2023-09-28 DIAGNOSIS — Z6825 Body mass index (BMI) 25.0-25.9, adult: Secondary | ICD-10-CM | POA: Diagnosis not present

## 2023-09-28 DIAGNOSIS — H9190 Unspecified hearing loss, unspecified ear: Secondary | ICD-10-CM | POA: Diagnosis not present

## 2023-09-28 DIAGNOSIS — M533 Sacrococcygeal disorders, not elsewhere classified: Secondary | ICD-10-CM | POA: Diagnosis not present

## 2023-10-08 DIAGNOSIS — H903 Sensorineural hearing loss, bilateral: Secondary | ICD-10-CM | POA: Diagnosis not present

## 2023-11-21 ENCOUNTER — Other Ambulatory Visit: Payer: Self-pay | Admitting: Sports Medicine

## 2023-11-21 DIAGNOSIS — M160 Bilateral primary osteoarthritis of hip: Secondary | ICD-10-CM

## 2024-01-05 ENCOUNTER — Other Ambulatory Visit: Payer: Self-pay | Admitting: Sports Medicine

## 2024-01-05 DIAGNOSIS — M160 Bilateral primary osteoarthritis of hip: Secondary | ICD-10-CM

## 2024-01-28 DIAGNOSIS — Z780 Asymptomatic menopausal state: Secondary | ICD-10-CM | POA: Diagnosis not present

## 2024-01-28 DIAGNOSIS — M159 Polyosteoarthritis, unspecified: Secondary | ICD-10-CM | POA: Diagnosis not present

## 2024-01-28 DIAGNOSIS — T7840XA Allergy, unspecified, initial encounter: Secondary | ICD-10-CM | POA: Diagnosis not present

## 2024-01-28 DIAGNOSIS — I10 Essential (primary) hypertension: Secondary | ICD-10-CM | POA: Diagnosis not present

## 2024-01-28 DIAGNOSIS — E785 Hyperlipidemia, unspecified: Secondary | ICD-10-CM | POA: Diagnosis not present

## 2024-01-28 DIAGNOSIS — R7302 Impaired glucose tolerance (oral): Secondary | ICD-10-CM | POA: Diagnosis not present

## 2024-01-28 DIAGNOSIS — Z79899 Other long term (current) drug therapy: Secondary | ICD-10-CM | POA: Diagnosis not present

## 2024-01-28 DIAGNOSIS — Z13820 Encounter for screening for osteoporosis: Secondary | ICD-10-CM | POA: Diagnosis not present

## 2024-01-28 DIAGNOSIS — Z0000 Encounter for general adult medical examination without abnormal findings: Secondary | ICD-10-CM | POA: Diagnosis not present

## 2024-01-28 DIAGNOSIS — E538 Deficiency of other specified B group vitamins: Secondary | ICD-10-CM | POA: Diagnosis not present

## 2024-01-28 DIAGNOSIS — I8393 Asymptomatic varicose veins of bilateral lower extremities: Secondary | ICD-10-CM | POA: Diagnosis not present

## 2024-01-28 DIAGNOSIS — E559 Vitamin D deficiency, unspecified: Secondary | ICD-10-CM | POA: Diagnosis not present

## 2024-01-30 ENCOUNTER — Other Ambulatory Visit: Payer: Self-pay | Admitting: Sports Medicine

## 2024-01-30 DIAGNOSIS — M160 Bilateral primary osteoarthritis of hip: Secondary | ICD-10-CM

## 2024-02-18 ENCOUNTER — Other Ambulatory Visit: Payer: Self-pay | Admitting: Vascular Surgery

## 2024-02-18 DIAGNOSIS — I781 Nevus, non-neoplastic: Secondary | ICD-10-CM

## 2024-03-02 ENCOUNTER — Encounter (HOSPITAL_COMMUNITY)

## 2024-03-23 ENCOUNTER — Ambulatory Visit (HOSPITAL_COMMUNITY)

## 2024-03-31 ENCOUNTER — Ambulatory Visit

## 2024-05-03 ENCOUNTER — Encounter: Payer: Self-pay | Admitting: Sports Medicine

## 2024-06-16 ENCOUNTER — Encounter: Admitting: Vascular Surgery

## 2024-06-16 ENCOUNTER — Encounter (HOSPITAL_COMMUNITY)

## 2024-07-07 ENCOUNTER — Encounter (HOSPITAL_COMMUNITY)

## 2024-07-07 ENCOUNTER — Encounter: Admitting: Vascular Surgery

## 2024-07-21 ENCOUNTER — Ambulatory Visit (HOSPITAL_COMMUNITY)

## 2024-07-21 ENCOUNTER — Ambulatory Visit: Admitting: Vascular Surgery

## 2024-08-02 DIAGNOSIS — D649 Anemia, unspecified: Secondary | ICD-10-CM | POA: Diagnosis not present

## 2024-08-02 DIAGNOSIS — E785 Hyperlipidemia, unspecified: Secondary | ICD-10-CM | POA: Diagnosis not present

## 2024-08-02 DIAGNOSIS — N951 Menopausal and female climacteric states: Secondary | ICD-10-CM | POA: Diagnosis not present

## 2024-08-02 DIAGNOSIS — Z6825 Body mass index (BMI) 25.0-25.9, adult: Secondary | ICD-10-CM | POA: Diagnosis not present

## 2024-08-02 DIAGNOSIS — E559 Vitamin D deficiency, unspecified: Secondary | ICD-10-CM | POA: Diagnosis not present

## 2024-08-02 DIAGNOSIS — E538 Deficiency of other specified B group vitamins: Secondary | ICD-10-CM | POA: Diagnosis not present

## 2024-08-02 DIAGNOSIS — I10 Essential (primary) hypertension: Secondary | ICD-10-CM | POA: Diagnosis not present

## 2024-08-02 DIAGNOSIS — Z131 Encounter for screening for diabetes mellitus: Secondary | ICD-10-CM | POA: Diagnosis not present

## 2024-08-02 DIAGNOSIS — M159 Polyosteoarthritis, unspecified: Secondary | ICD-10-CM | POA: Diagnosis not present

## 2024-09-14 NOTE — Progress Notes (Signed)
 "   Requested by:  Clemmie Nest, MD [address] [place],  [place] [address]  Reason for consultation: spider veins BLE    History of Present Illness   Kristin Rosales is a 69 y.o. (09-Jun-1956) female who presents for evaluation of bilateral lower extremity spider veins. She says she has had these for > 30 years but they have progressed more. She is retired now but worked as an [employer] for many years. She explains that she use to wear knee high compression stockings when working but does not wear them now. She does not elevate regularly. She says she is still up on her legs frequently- says she is not much of a sitter she likes to always be doing something. She does not report any real pain in her legs. She occasionally may have a little itching here and there in the spider veins. She does get swelling also in her legs. She says often she notices her sock marks especially around her ankles. She does not have any history of DVT. She says she recalls her mother and grandmother having varicosed veins.   Venous symptoms include: itching, swelling, spider veins Onset/duration:  meany years  Occupation:  retired [employer] Aggravating factors: none Alleviating factors: elevation Compression:  yes Helps:  yes Pain medications:  no Previous vein procedures:  no History of DVT:  no  Past Medical History:  Diagnosis Date   Abrasion of right ear canal 06/22/2017   Cellulitis of left lower extremity 03/25/2015   Essential hypertension 03/25/2015   Injury of left shoulder 12/26/2019   Left knee injury 12/26/2019   Left lumbar radiculitis 02/03/2020   Leg swelling 08/14/2014   Nausea 03/25/2015   Pain of left lower extremity 03/25/2015   Pain of right sacroiliac joint 11/20/2017   Primary osteoarthritis of both hips 01/19/2015   Primary osteoarthritis of both knees 02/21/2015   Primary osteoarthritis of left elbow 12/12/2019   Sensorineural hearing loss (SNHL) of both ears 04/19/2021   Spider veins  08/14/2014   Trochanteric bursitis of both hips 12/26/2019   Varicose vein of leg 08/14/2014    No past surgical history on file.  Social History   Socioeconomic History   Marital status: Married    Spouse name: Not on file   Number of children: Not on file   Years of education: Not on file   Highest education level: Not on file  Occupational History   Not on file  Tobacco Use   Smoking status: Unknown   Smokeless tobacco: Never  Substance and Sexual Activity   Alcohol use: Not on file   Drug use: Not on file   Sexual activity: Not on file  Other Topics Concern   Not on file  Social History Narrative   Not on file   Social Drivers of Health   Tobacco Use: Unknown (09/15/2024)   Patient History    Smoking Tobacco Use: Unknown    Smokeless Tobacco Use: Never    Passive Exposure: Not on file  Financial Resource Strain: Not on file  Food Insecurity: Not on file  Transportation Needs: Not on file  Physical Activity: Not on file  Stress: Not on file  Social Connections: Unknown (01/11/2022)   Received from [facility]   Social Network    Social Network: Not on file  Intimate Partner Violence: Unknown (12/03/2021)   Received from [facility]   HITS    Physically Hurt: Not on file    Insult or Talk Down  To: Not on file    Threaten Physical Harm: Not on file    Scream or Curse: Not on file  Depression (EYV7-0): Not on file  Alcohol Screen: Not on file  Housing: Not on file  Utilities: Not on file  Health Literacy: Not on file   No family history on file.  Current Outpatient Medications  Medication Sig Dispense Refill   acetaminophen  (TYLENOL ) 650 MG CR tablet Take 1 tablet (650 mg total) by mouth 2 (two) times daily. With celebrex      B Complex Vitamins (VITAMIN-B COMPLEX PO) Take 1 tablet by mouth daily.     CALCIUM-MAGNESIUM-ZINC PO Take 1 tablet by mouth in the morning and at bedtime. 1400 mg-25 mg-600 units     celecoxib  (CELEBREX ) 200 MG capsule TAKE 1 TO  2 CAPSULES BY MOUTH EVERY DAY AS NEEDED FOR PAIN (NEED APPOINTMENT FOR MORE REFILLS) 60 capsule 0   Cholecalciferol (VITAMIN D3) 50 MCG (2000 UT) TABS Take 1 tablet by mouth daily.     metoprolol  succinate (TOPROL -XL) 50 MG 24 hr tablet Take 1 tablet (50 mg total) by mouth daily. Take with or immediately following a meal. 90 tablet 3   OVER THE COUNTER MEDICATION Take 750 mg by mouth daily. Green Lipped Mussel     predniSONE  (STERAPRED UNI-PAK 48 TAB) 10 MG (48) TBPK tablet Take by mouth daily. 12-day taper pack, use as directed for taper (Patient taking differently: Take 1 tablet by mouth daily. 12-day taper pack, use as directed for taper) 1 tablet 0   Probiotic Product (PROBIOTIC DAILY PO) Take 60 Billion Cells by mouth daily.     Specialty Vitamins Products (COLLAGEN ULTRA PO) Take 3,300 mg by mouth daily.     traMADol  (ULTRAM ) 50 MG tablet Take 1 tablet (50 mg total) by mouth every 8 (eight) hours as needed for moderate pain (pain score 4-6). 21 tablet 0   Vit C-Cholecalciferol-Rose Hip (VITAMIN C & D3/ROSE HIPS PO) Take 1,000 mg by mouth daily.     No current facility-administered medications for this visit.    Allergies[1]  REVIEW OF SYSTEMS (negative unless checked):   Cardiac:  []  Chest pain or chest pressure? []  Shortness of breath upon activity? []  Shortness of breath when lying flat? []  Irregular heart rhythm?  Vascular:  []  Pain in calf, thigh, or hip brought on by walking? []  Pain in feet at night that wakes you up from your sleep? []  Blood clot in your veins? [x]  Leg swelling?  Pulmonary:  []  Oxygen at home? []  Productive cough? []  Wheezing?  Neurologic:  []  Sudden weakness in arms or legs? []  Sudden numbness in arms or legs? []  Sudden onset of difficult speaking or slurred speech? []  Temporary loss of vision in one eye? []  Problems with dizziness?  Gastrointestinal:  []  Blood in stool? []  Vomited blood?  Genitourinary:  []  Burning when urinating? []   Blood in urine?  Psychiatric:  []  Major depression  Hematologic:  []  Bleeding problems? []  Problems with blood clotting?  Dermatologic:  []  Rashes or ulcers?  Constitutional:  []  Fever or chills?  Ear/Nose/Throat:  []  Change in hearing? []  Nose bleeds? []  Sore throat?  Musculoskeletal:  []  Back pain? []  Joint pain? []  Muscle pain?   Physical Examination     Vitals:   09/15/24 1005  BP: (!) 168/91  Pulse: 64  Temp: 97.7 F (36.5 C)  TempSrc: Temporal  Weight: 148 lb 14.4 oz (67.5 kg)   Body mass index is 24.78  kg/m.  General:  WDWN in NAD; vital signs documented above Gait: normal HENT: WNL, normocephalic Pulmonary: normal non-labored breathing , without Rales, rhonchi,  wheezing Cardiac: regular HR Abdomen: soft Vascular Exam/Pulses:2+ DP and PT pulses bilaterally, feet warm and well perfused Extremities: with only a couple varicose veins on left lateral leg, with reticular veins and spider veins, left greater than right leg, with mild edema, without stasis pigmentation, without lipodermatosclerosis, without ulcers Musculoskeletal: no muscle wasting or atrophy  Neurologic: A&O X 3;  No focal weakness or paresthesias are detected Psychiatric:  The pt has Normal affect.  Non-invasive Vascular Imaging   BLE Venous Insufficiency Duplex (09/15/24):  LLE: No DVT and SVT GSV reflux  SFJ and prox thigh, knee GSV diameter 0.18-0.56 cm No SSV reflux  CFV deep venous reflux   Medical Decision Making   Lucina Betty is a 69 y.o. female who presents with: LLE chronic venous insufficiency with swelling and spider veins. Her duplex shows no DVT or SVT. She does have some reflux in her GSV and CFV. Her GSV is very small. Based on this duplex she is not a candidate for venous ablation. I discussed with her option for sclerotherapy of her spider veins. She understands this is considered a cosmetic procedure and would not be covered by insurance. I provided her with Inocente Row RN's card to contact if she decides in the future she would like to proceed.  Based on the patient's history and examination, I recommend: daily elevation of 20-30 minutes above level of heart, daily compression stocking use, exercise, weight reduction, refraining from prolonged sitting or standing. I discussed with the patient the use of her 15-20 mm knee high compression stockings. She has many pairs at home so she elected to not get any at today's visit Patient was provided with information about vein health She can follow up as needed if she has any new or worsening symptoms   Teretha Damme, PA-C [facility] Office: [phone]  09/15/2024, 10:13 AM  Clinic MD: [place]     [1]  Allergies Allergen Reactions   Insect Extract Anaphylaxis, Hives, Itching, Nausea And Vomiting, Swelling and Rash    Ant bite   Penicillins Hives    PATIENT STATED WHEN SHE WAS A CHILD SHE BROKE OUT IN HIVES AFTER TAKING PENICILLIN   Sulfa Antibiotics    "

## 2024-09-15 ENCOUNTER — Ambulatory Visit: Admitting: Physician Assistant

## 2024-09-15 ENCOUNTER — Ambulatory Visit (HOSPITAL_COMMUNITY)
Admission: RE | Admit: 2024-09-15 | Discharge: 2024-09-15 | Disposition: A | Discharge status: Home / Self Care | Source: Ambulatory Visit | Attending: Vascular Surgery | Admitting: Vascular Surgery

## 2024-09-15 VITALS — BP 168/91 | HR 64 | Temp 97.7°F | Wt 148.9 lb

## 2024-09-15 DIAGNOSIS — I781 Nevus, non-neoplastic: Secondary | ICD-10-CM

## 2024-09-15 DIAGNOSIS — I872 Venous insufficiency (chronic) (peripheral): Secondary | ICD-10-CM
